# Patient Record
Sex: Female | Born: 1995 | Race: White | Hispanic: No | Marital: Married | Smoking: Never smoker
Health system: Southern US, Community
[De-identification: ages and names within clinical notes are randomized; demographics above are authoritative.]

## PROBLEM LIST (undated history)

## (undated) DIAGNOSIS — N921 Excessive and frequent menstruation with irregular cycle: Secondary | ICD-10-CM

## (undated) DIAGNOSIS — R112 Nausea with vomiting, unspecified: Secondary | ICD-10-CM

## (undated) DIAGNOSIS — F419 Anxiety disorder, unspecified: Secondary | ICD-10-CM

## (undated) DIAGNOSIS — F988 Other specified behavioral and emotional disorders with onset usually occurring in childhood and adolescence: Secondary | ICD-10-CM

## (undated) HISTORY — DX: Nausea with vomiting, unspecified: R11.2

## (undated) HISTORY — DX: Other specified behavioral and emotional disorders with onset usually occurring in childhood and adolescence: F98.8

## (undated) HISTORY — PX: WISDOM TOOTH EXTRACTION: SHX21

## (undated) HISTORY — DX: Excessive and frequent menstruation with irregular cycle: N92.1

---

## 2015-01-15 ENCOUNTER — Encounter: Payer: Self-pay | Admitting: *Deleted

## 2015-01-19 ENCOUNTER — Encounter: Payer: Self-pay | Admitting: Obstetrics and Gynecology

## 2015-01-19 ENCOUNTER — Ambulatory Visit (INDEPENDENT_AMBULATORY_CARE_PROVIDER_SITE_OTHER): Payer: BLUE CROSS/BLUE SHIELD | Admitting: Obstetrics and Gynecology

## 2015-01-19 VITALS — BP 106/72 | HR 97 | Ht 61.0 in | Wt 160.6 lb

## 2015-01-19 DIAGNOSIS — N921 Excessive and frequent menstruation with irregular cycle: Secondary | ICD-10-CM | POA: Diagnosis not present

## 2015-01-19 DIAGNOSIS — N926 Irregular menstruation, unspecified: Secondary | ICD-10-CM | POA: Diagnosis not present

## 2015-01-19 LAB — POCT URINE PREGNANCY: Preg Test, Ur: NEGATIVE

## 2015-01-19 MED ORDER — REPHRESH VA GEL: 1.0000 | VAGINAL | Status: DC | PRN

## 2015-01-19 MED ORDER — MEDROXYPROGESTERONE ACETATE 150 MG/ML IM SUSP: 150.0000 mg | INTRAMUSCULAR | Status: DC

## 2015-01-19 MED ORDER — MEDROXYPROGESTERONE ACETATE 150 MG/ML IM SUSP
150.0000 mg | Freq: Once | INTRAMUSCULAR | Status: AC
  Administered 2015-01-19: 150 mg via INTRAMUSCULAR

## 2015-01-19 NOTE — Addendum Note (Signed)
Addended by: Rosine BeatLONTZ, Cherron Blitzer L on: 01/19/2015 04:11 PM   Modules accepted: Orders

## 2015-01-19 NOTE — Progress Notes (Signed)
Patient ID: Elizabeth Wolf, female   DOB: [birth year], 19 y.o.   MRN: [id number]  S: reports intermittent BTB with Lo Seasonique and nausea.has been off med x 1 month and now no menses with 2 negative UPT.   Reports occasional strong vaginal odor since getting married and being sexually active, denies pain or irritation.  O: A&O X4 Well groomed well developed female in no distress  pelvic exam deferred as not having s/s now  A: BTB on OCPs with unwanted side-effects Suspected occasional vaginosis  UPT negative  P: D/C OCP Start Depo- rx sent in and first injection today, RTC 3 months  Counseled on RepHresh use PRN.   Elizabeth Wolf, CNM

## 2015-01-19 NOTE — Patient Instructions (Signed)

## 2015-04-13 ENCOUNTER — Ambulatory Visit (INDEPENDENT_AMBULATORY_CARE_PROVIDER_SITE_OTHER): Payer: BLUE CROSS/BLUE SHIELD

## 2015-04-13 ENCOUNTER — Ambulatory Visit: Payer: BLUE CROSS/BLUE SHIELD

## 2015-04-13 DIAGNOSIS — N921 Excessive and frequent menstruation with irregular cycle: Secondary | ICD-10-CM

## 2015-04-13 MED ORDER — MEDROXYPROGESTERONE ACETATE 150 MG/ML IM SUSP: 150.0000 mg | INTRAMUSCULAR | Status: DC

## 2015-04-13 NOTE — Progress Notes (Signed)
Patient ID: Elizabeth Wolf, female   DOB: Sep 17, 1995, 19 y.o.   MRN: [id number]   Pt did not have depo with her. Pt was going to get depo at pharmacy. Called back and spoke with RF-decided to r/s appt instead.

## 2015-04-17 ENCOUNTER — Ambulatory Visit: Payer: BLUE CROSS/BLUE SHIELD

## 2016-10-31 ENCOUNTER — Telehealth: Payer: Self-pay | Admitting: Obstetrics and Gynecology

## 2016-10-31 NOTE — Telephone Encounter (Signed)
Triage call report received - patient states her period is 4 months late she has taken multiple pregnancy test - not pregnant having nausea and vomiting - attempt to contact patient to schedule appointment LVM

## 2017-05-29 ENCOUNTER — Encounter: Payer: BLUE CROSS/BLUE SHIELD | Admitting: Certified Nurse Midwife

## 2017-06-01 ENCOUNTER — Other Ambulatory Visit: Payer: Self-pay | Admitting: Certified Nurse Midwife

## 2017-06-01 ENCOUNTER — Other Ambulatory Visit (INDEPENDENT_AMBULATORY_CARE_PROVIDER_SITE_OTHER): Payer: Self-pay

## 2017-06-01 ENCOUNTER — Encounter: Payer: Self-pay | Admitting: Certified Nurse Midwife

## 2017-06-01 ENCOUNTER — Ambulatory Visit: Payer: Self-pay | Admitting: Certified Nurse Midwife

## 2017-06-01 VITALS — BP 97/71 | HR 88 | Ht 61.0 in | Wt 179.1 lb

## 2017-06-01 DIAGNOSIS — Z3687 Encounter for antenatal screening for uncertain dates: Secondary | ICD-10-CM

## 2017-06-01 DIAGNOSIS — N926 Irregular menstruation, unspecified: Secondary | ICD-10-CM

## 2017-06-01 DIAGNOSIS — N921 Excessive and frequent menstruation with irregular cycle: Secondary | ICD-10-CM

## 2017-06-01 NOTE — Progress Notes (Signed)
GYN ENCOUNTER NOTE  Subjective:       Shyler A Doreene ElandHendrix is a 21 y.o. [id number] female here for pregnancy confirmation.   Reports four (4) positive home pregnancy tests last week and occasional lower abdominal cramping.   Denies difficulty breathing or respiratory distress, chest pain, abdominal pain, vaginal bleeding, and leg pain or swelling.   History significant for irregular periods.    Gynecologic History  No LMP recorded (lmp unknown). Patient is pregnant.  Contraception: none  Last Pap: due.   Obstetric History  OB History  Gravida Para Term Preterm AB Living  1 0 0 0 0 0  SAB TAB Ectopic Multiple Live Births  0 0 0 0      # Outcome Date GA Lbr Len/2nd Weight Sex Delivery Anes PTL Lv  1 Current               Past Medical History:  Diagnosis Date  . ADD (attention deficit disorder)   . Breakthrough bleeding on OCPs   . Nausea & vomiting     Past Surgical History:  Procedure Laterality Date  . WISDOM TOOTH EXTRACTION     No Known Allergies  Social History   Socioeconomic History  . Marital status: Married    Spouse name: Not on file  . Number of children: Not on file  . Years of education: Not on file  . Highest education level: Not on file  Social Needs  . Financial resource strain: Not on file  . Food insecurity - worry: Not on file  . Food insecurity - inability: Not on file  . Transportation needs - medical: Not on file  . Transportation needs - non-medical: Not on file  Occupational History  . Not on file  Tobacco Use  . Smoking status: Never Smoker  . Smokeless tobacco: Never Used  Substance and Sexual Activity  . Alcohol use: No  . Drug use: No  . Sexual activity: Yes    Birth control/protection: None  Other Topics Concern  . Not on file  Social History Narrative  . Not on file    Family History  Problem Relation Age of Onset  . Diabetes Mother   . Cancer Paternal Grandfather     The following portions of the patient's  history were reviewed and updated as appropriate: allergies, current medications, past family history, past medical history, past social history, past surgical history and problem list.  Review of Systems  Review of Systems - Negative except as noted above.  History obtained from the patient.   Objective:   BP 97/71   Pulse 88   Ht 5\' 1"  (1.549 m)   Wt 179 lb 1 oz (81.2 kg)   LMP  (LMP Unknown)   BMI 33.83 kg/m    CONSTITUTIONAL: Well-developed, well-nourished female in no acute distress.   HENT:  Normocephalic, atraumatic.   NECK: Normal range of motion, supple, no masses.     SKIN: Skin is warm and dry. No rash noted. Not diaphoretic. No erythema. No pallor.  NEUROLGIC: Alert and oriented to person, place, and time.   PSYCHIATRIC: Normal mood and affect. Normal behavior. Normal judgment and thought content.  ABDOMEN: Soft, non distended; Non tender.  No Organomegaly.  MUSCULOSKELETAL: Normal range of motion. No tenderness.  No cyanosis, clubbing, or edema.  Positive UPT  ULTRASOUND REPORT  Location: [facility] Date of Service:  06/01/17  Indications:  Unsure LMP Findings:  A probable gestational sac is visualized within the  endometrium in the fundus of the uterus measuring 5 6/[redacted] weeks gestation.  Gestational sac appears irregular and a small [facility] is noted measuring 6.8 x 8.1 x 3.5 mm.  A fetal pole is not visualized at this time.    Yolk sac appears irregular.  Right Ovary measures 2.5 x 1.2 x 1.4 cm and appears WNL.  Left Ovary measures 3.1 x 2.3 x 2.3 cm and appears WNL.  There is no obvious evidence of a corpus luteal cyst. Survey of the adnexa demonstrates a left adnexal mass measuring 2.9 x 2.7 x 2.4 cm.  Color flow is noted to this mass. There is no free peritoneal fluid in the cul de sac.  Impression: 1. 5 6/7 week irregular gestational sac seen by U/S on today's exam. 2. No fetal pole visualized at this time. 3. Mass noted in left adnexa  lying between uterus and left ovary measuring 2.9 x 2.7 x 2.4 cm.  Recommendations: 1.Clinical correlation with the patient's History and Physical Exam. 2. F/U U/S recommended in 2 weeks to reassess viability and re-evaluate left adnexal mass.  Assessment:   1. Missed menses  - POCT urine pregnancy - Beta HCG, Quant  2. Unsure of LMP (last menstrual period) as reason for ultrasound scan  Plan:   Labs: Beta, will notify pt via MyChart with results.   Reviewed red flag symptoms and when to call.  RTC x 2 weeks for follow up US or sooner if needed.    Gunnar BullaJenkins Michelle Tomer Chalmers, CNM

## 2017-06-01 NOTE — Patient Instructions (Signed)
Morning Sickness Morning sickness is when you feel sick to your stomach (nauseous) during pregnancy. You may feel sick to your stomach and throw up (vomit). You may feel sick in the morning, but you can feel this way any time of day. Some women feel very sick to their stomach and cannot stop throwing up (hyperemesis gravidarum). Follow these instructions at home:  Only take medicines as told by your doctor.  Take multivitamins as told by your doctor. Taking multivitamins before getting pregnant can stop or lessen the harshness of morning sickness.  Eat dry toast or unsalted crackers before getting out of bed.  Eat 5 to 6 small meals a day.  Eat dry and bland foods like rice and baked potatoes.  Do not drink liquids with meals. Drink between meals.  Do not eat greasy, fatty, or spicy foods.  Have someone cook for you if the smell of food causes you to feel sick or throw up.  If you feel sick to your stomach after taking prenatal vitamins, take them at night or with a snack.  Eat protein when you need a snack (nuts, yogurt, cheese).  Eat unsweetened gelatins for dessert.  Wear a bracelet used for sea sickness (acupressure wristband).  Go to a doctor that puts thin needles into certain body points (acupuncture) to improve how you feel.  Do not smoke.  Use a humidifier to keep the air in your house free of odors.  Get lots of fresh air. Contact a doctor if:  You need medicine to feel better.  You feel dizzy or lightheaded.  You are losing weight. Get help right away if:  You feel very sick to your stomach and cannot stop throwing up.  You pass out (faint). This information is not intended to replace advice given to you by your health care provider. Make sure you discuss any questions you have with your health care provider. Document Released: 08/14/2004 Document Revised: 12/13/2015 Document Reviewed: 12/22/2012 Elsevier Interactive Patient Education  2017 [place] for Pregnant Women While you are pregnant, your body will require additional nutrition to help support your growing baby. It is recommended that you consume:  150 additional calories each day during your first trimester.  300 additional calories each day during your second trimester.  300 additional calories each day during your third trimester.  Eating a healthy, well-balanced diet is very important for your health and for your baby's health. You also have a higher need for some vitamins and minerals, such as folic acid, calcium, iron, and vitamin D. What do I need to know about eating during pregnancy?  Do not try to lose weight or go on a diet during pregnancy.  Choose healthy, nutritious foods. Choose  of a sandwich with a glass of milk instead of a candy bar or a high-calorie sugar-sweetened beverage.  Limit your overall intake of foods that have "empty calories." These are foods that have little nutritional value, such as sweets, desserts, candies, sugar-sweetened beverages, and fried foods.  Eat a variety of foods, especially fruits and vegetables.  Take a prenatal vitamin to help meet the additional needs during pregnancy, specifically for folic acid, iron, calcium, and vitamin D.  Remember to stay active. Ask your health care provider for exercise recommendations that are specific to you.  Practice good food safety and cleanliness, such as washing your hands before you eat and after you prepare raw meat. This helps to prevent foodborne illnesses, such as listeriosis, that can  be very dangerous for your baby. Ask your health care provider for more information about listeriosis. What does 150 extra calories look like? Healthy options for an additional 150 calories each day could be any of the following:  Plain low-fat yogurt (6-8 oz) with  cup of berries.  1 apple with 2 teaspoons of peanut butter.  Cut-up vegetables with  cup of hummus.  Low-fat  chocolate milk (8 oz or 1 cup).  1 string cheese with 1 medium orange.   of a peanut butter and jelly sandwich on whole-wheat bread (1 tsp of peanut butter).  For 300 calories, you could eat two of those healthy options each day. What is a healthy amount of weight to gain? The recommended amount of weight for you to gain is based on your pre-pregnancy BMI. If your pre-pregnancy BMI was:  Less than 18 (underweight), you should gain 28-40 lb.  18-24.9 (normal), you should gain 25-35 lb.  25-29.9 (overweight), you should gain 15-25 lb.  Greater than 30 (obese), you should gain 11-20 lb.  What if I am having twins or multiples? Generally, pregnant women who will be having twins or multiples may need to increase their daily calories by 300-600 calories each day. The recommended range for total weight gain is 25-54 lb, depending on your pre-pregnancy BMI. Talk with your health care provider for specific guidance about additional nutritional needs, weight gain, and exercise during your pregnancy. What foods can I eat? Grains Any grains. Try to choose whole grains, such as whole-wheat bread, oatmeal, or brown rice. Vegetables Any vegetables. Try to eat a variety of colors and types of vegetables to get a full range of vitamins and minerals. Remember to wash your vegetables well before eating. Fruits Any fruits. Try to eat a variety of colors and types of fruit to get a full range of vitamins and minerals. Remember to wash your fruits well before eating. Meats and Other Protein Sources Lean meats, including chicken, [place], fish, and lean cuts of beef, veal, or pork. Make sure that all meats are cooked to "well done." Tofu. Tempeh. Beans. Eggs. Peanut butter and other nut butters. Seafood, such as shrimp, crab, and lobster. If you choose fish, select types that are higher in omega-3 fatty acids, including salmon, herring, mussels, trout, sardines, and pollock. Make sure that all meats are cooked  to food-safe temperatures. Dairy Pasteurized milk and milk alternatives. Pasteurized yogurt and pasteurized cheese. Cottage cheese. Sour cream. Beverages Water. Juices that contain 100% fruit juice or vegetable juice. Caffeine-free teas and decaffeinated coffee. Drinks that contain caffeine are okay to drink, but it is better to avoid caffeine. Keep your total caffeine intake to less than 200 mg each day (12 oz of coffee, tea, or soda) or as directed by your health care provider. Condiments Any pasteurized condiments. Sweets and Desserts Any sweets and desserts. Fats and Oils Any fats and oils. The items listed above may not be a complete list of recommended foods or beverages. Contact your dietitian for more options. What foods are not recommended? Vegetables Unpasteurized (raw) vegetable juices. Fruits Unpasteurized (raw) fruit juices. Meats and Other Protein Sources Cured meats that have nitrates, such as bacon, salami, and hotdogs. Luncheon meats, bologna, or other deli meats (unless they are reheated until they are steaming hot). Refrigerated pate, meat spreads from a meat counter, smoked seafood that is found in the refrigerated section of a store. Raw fish, such as sushi or sashimi. High mercury content fish, such as tilefish,  shark, swordfish, and king mackerel. Raw meats, such as tuna or beef tartare. Undercooked meats and poultry. Make sure that all meats are cooked to food-safe temperatures. Dairy Unpasteurized (raw) milk and any foods that have raw milk in them. Soft cheeses, such as feta, queso blanco, queso fresco, Brie, Camembert cheeses, blue-veined cheeses, and Panela cheese (unless it is made with pasteurized milk, which must be stated on the label). Beverages Alcohol. Sugar-sweetened beverages, such as sodas, teas, or energy drinks. Condiments Homemade fermented foods and drinks, such as pickles, sauerkraut, or kombucha drinks. (Store-bought pasteurized versions of these  are okay.) Other Salads that are made in the store, such as ham salad, chicken salad, egg salad, tuna salad, and seafood salad. The items listed above may not be a complete list of foods and beverages to avoid. Contact your dietitian for more information. This information is not intended to replace advice given to you by your health care provider. Make sure you discuss any questions you have with your health care provider. Document Released: 04/21/2014 Document Revised: 12/13/2015 Document Reviewed: 12/20/2013 Elsevier Interactive Patient Education  2018 [employer]. Common Medications Safe in Pregnancy  Acne:      Constipation:  Benzoyl Peroxide     Colace  Clindamycin      Dulcolax Suppository  Topica Erythromycin     Fibercon  Salicylic Acid      Metamucil         Miralax AVOID:        Senakot   Accutane    Cough:  Retin-A       Cough Drops  Tetracycline      Phenergan w/ Codeine if Rx  Minocycline      Robitussin (Plain & DM)  Antibiotics:     Crabs/Lice:  Ceclor       RID  Cephalosporins    AVOID:  E-Mycins      Kwell  Keflex  Macrobid/Macrodantin   Diarrhea:  Penicillin      Kao-Pectate  Zithromax      Imodium AD         PUSH FLUIDS AVOID:       Cipro     Fever:  Tetracycline      Tylenol (Regular or Extra  Minocycline       Strength)  Levaquin      Extra Strength-Do not          Exceed 8 tabs/24 hrs Caffeine:        <280m/day (equiv. To 1 cup of coffee or  approx. 3 12 oz sodas)         Gas: Cold/Hayfever:       Gas-X  Benadryl      Mylicon  Claritin       Phazyme  **Claritin-D        Chlor-Trimeton    Headaches:  Dimetapp      ASA-Free Excedrin  Drixoral-Non-Drowsy     Cold Compress  Mucinex (Guaifenasin)     Tylenol (Regular or Extra  Sudafed/Sudafed-12 Hour     Strength)  **Sudafed PE Pseudoephedrine   Tylenol Cold & Sinus     Vicks Vapor Rub  Zyrtec  **AVOID if Problems With Blood Pressure         Heartburn: Avoid lying down for at least 1  hour after meals  Aciphex      Maalox     Rash:  Milk of Magnesia     Benadryl    Mylanta  1% Hydrocortisone Cream  Pepcid  Pepcid Complete   Sleep Aids:  Prevacid      Ambien   Prilosec       Benadryl  Rolaids       Chamomile Tea  Tums (Limit 4/day)     Unisom  Zantac       Tylenol PM         Warm milk-add vanilla or  Hemorrhoids:       Sugar for taste  Anusol/Anusol H.C.  (RX: Analapram 2.5%)  Sugar Substitutes:  Hydrocortisone OTC     Ok in moderation  Preparation H      Tucks        Vaseline lotion applied to tissue with wiping    Herpes:     Throat:  Acyclovir      Oragel  Famvir  Valtrex     Vaccines:         Flu Shot Leg Cramps:       *Gardasil  Benadryl      Hepatitis A         Hepatitis B Nasal Spray:       Pneumovax  Saline Nasal Spray     Polio Booster         Tetanus Nausea:       Tuberculosis test or PPD  Vitamin B6 25 mg TID   AVOID:    Dramamine      *Gardasil  Emetrol       Live Poliovirus  Ginger Root 250 mg QID    MMR (measles, mumps &  High Complex Carbs @ Bedtime    rebella)  Sea Bands-Accupressure    Varicella (Chickenpox)  Unisom 1/2 tab TID     *No known complications           If received before Pain:         Known pregnancy;   Darvocet       Resume series after  Lortab        Delivery  Percocet    Yeast:   Tramadol      Femstat  Tylenol 3      Gyne-lotrimin  Ultram       Monistat  Vicodin           MISC:         All Sunscreens           Hair Coloring/highlights          Insect Repellant's          (Including DEET)         Mystic Tans

## 2017-06-01 NOTE — Progress Notes (Signed)
New pt is here for a confirmation of pregnancy. Unsure of LMP.

## 2017-06-02 ENCOUNTER — Telehealth: Payer: Self-pay | Admitting: Certified Nurse Midwife

## 2017-06-02 DIAGNOSIS — N9489 Other specified conditions associated with female genital organs and menstrual cycle: Secondary | ICD-10-CM

## 2017-06-02 DIAGNOSIS — N926 Irregular menstruation, unspecified: Secondary | ICD-10-CM

## 2017-06-02 LAB — BETA HCG QUANT (REF LAB): HCG QUANT: 9303 m[IU]/mL

## 2017-06-02 NOTE — Telephone Encounter (Signed)
1200: Lab results and plan of care as outlined above reviewed with patient. Pt verbalized understanding. Placed on hold to schedule appointment for lab draw tomorrow and follow up appointment Thursday.   Reviewed red flag symptoms and when to call.    Gunnar BullaJenkins Michelle Tamie Minteer, CNM [facility], [facility]

## 2017-06-02 NOTE — Telephone Encounter (Signed)
0830: Lab results and ultrasound findings reviewed with Dr. Valentino Saxonherry. Advised repeat Beta on Wednesday and follow up with patient on Thursday for results review. Depending on results follow up ultrasound should be scheduled for Monday. Will contact patient to discuss plan of care.   Gunnar BullaJenkins Michelle Kasey Hansell, CNM [facility], [facility]  0904:HIPPA approved message left on identified voicemail asking patient to call back.    Gunnar BullaJenkins Michelle Ameliarose Shark, CNM [facility], [facility]

## 2017-06-03 ENCOUNTER — Other Ambulatory Visit: Payer: Self-pay

## 2017-06-03 DIAGNOSIS — N926 Irregular menstruation, unspecified: Secondary | ICD-10-CM

## 2017-06-03 DIAGNOSIS — N9489 Other specified conditions associated with female genital organs and menstrual cycle: Secondary | ICD-10-CM

## 2017-06-03 LAB — BETA HCG QUANT (REF LAB): HCG QUANT: 15885 m[IU]/mL

## 2017-06-04 ENCOUNTER — Ambulatory Visit (INDEPENDENT_AMBULATORY_CARE_PROVIDER_SITE_OTHER): Payer: BLUE CROSS/BLUE SHIELD | Admitting: Certified Nurse Midwife

## 2017-06-04 ENCOUNTER — Encounter: Payer: Self-pay | Admitting: Certified Nurse Midwife

## 2017-06-04 VITALS — BP 110/65 | HR 82 | Wt 181.9 lb

## 2017-06-04 DIAGNOSIS — Z09 Encounter for follow-up examination after completed treatment for conditions other than malignant neoplasm: Secondary | ICD-10-CM | POA: Diagnosis not present

## 2017-06-04 DIAGNOSIS — N9489 Other specified conditions associated with female genital organs and menstrual cycle: Secondary | ICD-10-CM

## 2017-06-04 DIAGNOSIS — N926 Irregular menstruation, unspecified: Secondary | ICD-10-CM | POA: Diagnosis not present

## 2017-06-04 DIAGNOSIS — N949 Unspecified condition associated with female genital organs and menstrual cycle: Secondary | ICD-10-CM

## 2017-06-04 NOTE — Patient Instructions (Signed)
Molar Pregnancy A molar pregnancy (hydatidiform mole) is a mass of tissue that grows in the uterus after conception. The mass is created by an egg that was not fertilized correctly and abnormally grows. It is an abnormal pregnancy and does not develop into a fetus. If a molar pregnancy is suspected by your health care provider, treatment is required. What are the causes? Molar pregnancy is caused by an egg that is fertilized incorrectly so that it has abnormal genetic material (chromosomes). This can result in one of 2 types of molar pregnancy:  Complete molar pregnancy-All of the chromosomes in the fertilized egg come from the father; none come from the mother.  Partial molar pregnancy-The fertilized egg has chromosomes from the father and mother, but it has too many chromosomes.  What increases the risk? Certain risk factors make a molar pregnancy more likely. They include:  Being over age 23 or under age 23.  History of a molar pregnancy in the past (extremely small chance of recurrence).  Other possible risk factors include:  Smoking more than 15 cigarettes per day.  History of infertility.  Having a certain blood type (A, B, AB).  Having a vitamin A deficiency.  Using oral contraceptives.  What are the signs or symptoms?  Vaginal bleeding.  Missed menstrual period.  Uterus grows quicker than normal.  Severe nausea and vomiting.  Severe pressure or pain in the uterus.  Abnormal ovarian cysts (theca lutein cysts).  Discharge from the vagina that looks like grapes.  High blood pressure (early onset of preeclampsia).  Overactive thyroid (hyperthyroidism).  Anemia. How is this diagnosed? If your health care provider thinks there is a chance of a molar pregnancy, testing will be recommended. Possible tests include:  An ultrasound test.  Blood tests.  How is this treated? Most molar pregnancies end on their own by miscarriage. However, a health care provider  needs to make sure that all the abnormal tissue is out of the womb. This can be done with dilation and curettage (D&C) or suction curettage. In this procedure, any remaining molar tissue is removed through the vagina. After diagnosis of a molar pregnancy, the pregnancy hormone levels must be followed until the level is zero. If the pregnancy hormone level does not drop appropriately, chemotherapy may be necessary. Also, you will be given a medicine called Rho (D) immune globulin if you are Rh negative and your sex partner is Rh positive. This helps prevent Rh problems in future pregnancies. Follow these instructions at home:  Avoid getting pregnant for 6-12 months or as directed by your health care provider. Use a reliable form of birth control or do not have sex.  Only take over-the-counter or prescription medicine as directed by your health care provider.  Keep all follow-up appointments and get all suggested lab tests and ultrasound tests.  Gradually return to normal activities.  Think about joining a support group. Ask for help if you are struggling with grief. This information is not intended to replace advice given to you by your health care provider. Make sure you discuss any questions you have with your health care provider. Document Released: 03/25/2011 Document Revised: 12/13/2015 Document Reviewed: 02/03/2013 Elsevier Interactive Patient Education  2017 [employer] Ectopic Pregnancy An ectopic pregnancy happens when a fertilized egg grows outside the uterus. A pregnancy cannot live outside of the uterus. This problem often happens in the fallopian tube. It is often caused by damage to the fallopian tube. If this problem is found early, you  may be treated with medicine. If your tube tears or bursts open (ruptures), you will bleed inside. This is an emergency. You will need surgery. Get help right away. What are the signs or symptoms? You may have normal pregnancy symptoms at first.  These include:  Missing your period.  Feeling sick to your stomach (nauseous).  Being tired.  Having tender breasts.  Then, you may start to have symptoms that are not normal. These include:  Pain with sex (intercourse).  Bleeding from the vagina. This includes light bleeding (spotting).  Belly (abdomen) or lower belly cramping or pain. This may be felt on one side.  A fast heartbeat (pulse).  Passing out (fainting) after going poop (bowel movement).  If your tube tears, you may have symptoms such as:  Really bad pain in the belly or lower belly. This happens suddenly.  Dizziness.  Passing out.  Shoulder pain.  Get help right away if: You have any of these symptoms. This is an emergency. This information is not intended to replace advice given to you by your health care provider. Make sure you discuss any questions you have with your health care provider. Document Released: 10/03/2008 Document Revised: 12/13/2015 Document Reviewed: 02/16/2013 Elsevier Interactive Patient Education  2017 [employer]

## 2017-06-05 ENCOUNTER — Other Ambulatory Visit: Payer: Self-pay | Admitting: Certified Nurse Midwife

## 2017-06-05 DIAGNOSIS — N911 Secondary amenorrhea: Secondary | ICD-10-CM

## 2017-06-05 DIAGNOSIS — Z3201 Encounter for pregnancy test, result positive: Secondary | ICD-10-CM

## 2017-06-06 ENCOUNTER — Encounter: Payer: Self-pay | Admitting: Certified Nurse Midwife

## 2017-06-06 NOTE — Progress Notes (Signed)
GYN ENCOUNTER NOTE  Subjective:       Elizabeth Wolf is a 21 y.o. G50P0000 female here for follow up appointment and results review.   Pt was first seen by myself on Monday, 06/01/2017, for pregnancy confirmation. An ultrasound was preformed due to history of irregular periods, abdominal discomfort/distention and obesity.   An irregular yolk sac and adnexal mass were visualized on exam. Strict ectopic precautions were given and patient returned to office on Wednesday for repeat beta hCG.   Pt and spouse are here together today for results review and plan of care discussion.   Denies difficulty breathing or respiratory distress, chest pain, abdominal pain, vaginal bleeding, dysuria, and leg pain or swelling.   Questions and fears cancer and loss of pregnancy.    Gynecologic History  No LMP recorded (lmp unknown). Patient is pregnant.  Contraception: none  Last Pap: due.  Obstetric History  OB History  Gravida Para Term Preterm AB Living  1 0 0 0 0 0  SAB TAB Ectopic Multiple Live Births  0 0 0 0      # Outcome Date GA Lbr Len/2nd Weight Sex Delivery Anes PTL Lv  1 Current               Past Medical History:  Diagnosis Date  . ADD (attention deficit disorder)   . Breakthrough bleeding on OCPs   . Nausea & vomiting     Past Surgical History:  Procedure Laterality Date  . WISDOM TOOTH EXTRACTION      Current Outpatient Medications on File Prior to Visit  Medication Sig Dispense Refill  . Prenatal Vit-Fe Fumarate-FA (PRENATAL MULTIVITAMIN) TABS tablet Take 1 tablet daily at 12 noon by mouth.     No current facility-administered medications on file prior to visit.     No Known Allergies  Social History   Socioeconomic History  . Marital status: Married    Spouse name: Not on file  . Number of children: Not on file  . Years of education: Not on file  . Highest education level: Not on file  Social Needs  . Financial resource strain: Not on file  . Food  insecurity - worry: Not on file  . Food insecurity - inability: Not on file  . Transportation needs - medical: Not on file  . Transportation needs - non-medical: Not on file  Occupational History  . Not on file  Tobacco Use  . Smoking status: Never Smoker  . Smokeless tobacco: Never Used  Substance and Sexual Activity  . Alcohol use: No  . Drug use: No  . Sexual activity: Yes    Birth control/protection: None  Other Topics Concern  . Not on file  Social History Narrative  . Not on file    Family History  Problem Relation Age of Onset  . Diabetes Mother   . Cancer Paternal Grandfather     The following portions of the patient's history were reviewed and updated as appropriate: allergies, current medications, past family history, past medical history, past social history, past surgical history and problem list.  Review of Systems Review of Systems - Negative except as noted above.  History obtained from the patient  Objective:   BP 110/65   Pulse 82   Wt 181 lb 14.4 oz (82.5 kg)   LMP  (LMP Unknown)   BMI 34.37 kg/m    CONSTITUTIONAL: Well-developed, well-nourished female in no acute distress.   HENT:  Normocephalic, atraumatic.   NECK:  Normal range of motion, supple, no masses.     SKIN: Skin is warm and dry. No rash noted. Not diaphoretic. No erythema. No pallor.  NEUROLGIC: Alert and oriented to person, place, and time.   PSYCHIATRIC: Normal mood and affect. Normal behavior. Normal judgment and thought content.  ABDOMEN: Soft, non distended; Non tender.  No Organomegaly.  MUSCULOSKELETAL: Normal range of motion. No tenderness.  No cyanosis, clubbing, or edema.  ULTRASOUND REPORT  Location: [facility] Date of Service:  06/01/17  Indications:  Unsure LMP Findings:  A probable gestational sac is visualized within the endometrium in the fundus of the uterus measuring 5 6/[redacted] weeks gestation.  Gestational sac appears irregular and a small [facility]  is noted measuring 6.8 x 8.1 x 3.5 mm.  A fetal pole is not visualized at this time.    Yolk sac appears irregular.  Right Ovary measures 2.5 x 1.2 x 1.4 cm and appears WNL.  Left Ovary measures 3.1 x 2.3 x 2.3 cm and appears WNL.  There is no obvious evidence of a corpus luteal cyst. Survey of the adnexa demonstrates a left adnexal mass measuring 2.9 x 2.7 x 2.4 cm.  Color flow is noted to this mass. There is no free peritoneal fluid in the cul de sac.  Impression: 1. 5 6/7 week irregular gestational sac seen by U/S on today's exam. 2. No fetal pole visualized at this time. 3. Mass noted in left adnexa lying between uterus and left ovary measuring 2.9 x 2.7 x 2.4 cm.  Recommendations: 1.Clinical correlation with the patient's History and Physical Exam. 2. F/U U/S recommended in 2 weeks to reassess viability and re-evaluate left adnexal mass.   Labs  Recent Results (from the past 2160 hour(s))  Beta HCG, Quant     Status: None   Collection Time: 06/01/17  2:02 PM  Result Value Ref Range   hCG Quant 9,303 mIU/mL    Comment:                      Female (Non-pregnant)    0 -     5                             (Postmenopausal)  0 -     8                      Female (Pregnant)                      Weeks of Gestation                              3                6 -    71                              4               10 -   750                              5              217 -  7138  6              158 - 31795                              7             [id number] -[id number]                              8            32065 -[id number]                              9            63803 -[id number]                             10            46509 -[id number]                             12            27832 -[id number]                             14            13950 - 62530                             15            12039 - 70971                             16             9040 -  56451                             17             8175 - [phone] Roche E CLIA methodology   Beta HCG, Quant     Status: None   Collection Time: 06/03/17  1:30 PM  Result Value Ref Range   hCG Quant 15,885 mIU/mL    Comment:                      Female (Non-pregnant)    0 -     5                             (Postmenopausal)  0 -     8                      Female (Pregnant)  Weeks of Gestation                              3                6 -    71                              4               10 -   750                              5              217 -  7138                              6              158 - [place]                              7             3697 -[place]  18             8099 - [id number] Roche E CLIA methodology       Assessment:   1. Missed menses   2. Adnexal mass, left   3. Follow up    Plan:   Ultrasound and lab results reviewed with patient and spouse in detail. Definitions given for mass, tumor, cancer, and pregnancy.   Advised repeat ultrasound needed this coming Monday.   Strict ectopic precautions given. Reviewed red flag symptoms and when to call, pt verbalized understanding.   RTC x Monday for follow up ultrasound.    Gunnar BullaJenkins Michelle Mishon Blubaugh, CNM

## 2017-06-08 ENCOUNTER — Encounter: Payer: Self-pay | Admitting: Certified Nurse Midwife

## 2017-06-08 ENCOUNTER — Ambulatory Visit (INDEPENDENT_AMBULATORY_CARE_PROVIDER_SITE_OTHER): Payer: BLUE CROSS/BLUE SHIELD | Admitting: Certified Nurse Midwife

## 2017-06-08 ENCOUNTER — Ambulatory Visit (INDEPENDENT_AMBULATORY_CARE_PROVIDER_SITE_OTHER): Payer: BLUE CROSS/BLUE SHIELD

## 2017-06-08 VITALS — BP 109/68 | HR 85 | Ht 61.0 in | Wt 179.1 lb

## 2017-06-08 DIAGNOSIS — N911 Secondary amenorrhea: Secondary | ICD-10-CM

## 2017-06-08 DIAGNOSIS — Z09 Encounter for follow-up examination after completed treatment for conditions other than malignant neoplasm: Secondary | ICD-10-CM

## 2017-06-08 DIAGNOSIS — N926 Irregular menstruation, unspecified: Secondary | ICD-10-CM

## 2017-06-08 DIAGNOSIS — Z3201 Encounter for pregnancy test, result positive: Secondary | ICD-10-CM

## 2017-06-08 NOTE — Patient Instructions (Signed)
Morning Sickness Morning sickness is when you feel sick to your stomach (nauseous) during pregnancy. You may feel sick to your stomach and throw up (vomit). You may feel sick in the morning, but you can feel this way any time of day. Some women feel very sick to their stomach and cannot stop throwing up (hyperemesis gravidarum). Follow these instructions at home:  Only take medicines as told by your doctor.  Take multivitamins as told by your doctor. Taking multivitamins before getting pregnant can stop or lessen the harshness of morning sickness.  Eat dry toast or unsalted crackers before getting out of bed.  Eat 5 to 6 small meals a day.  Eat dry and bland foods like rice and baked potatoes.  Do not drink liquids with meals. Drink between meals.  Do not eat greasy, fatty, or spicy foods.  Have someone cook for you if the smell of food causes you to feel sick or throw up.  If you feel sick to your stomach after taking prenatal vitamins, take them at night or with a snack.  Eat protein when you need a snack (nuts, yogurt, cheese).  Eat unsweetened gelatins for dessert.  Wear a bracelet used for sea sickness (acupressure wristband).  Go to a doctor that puts thin needles into certain body points (acupuncture) to improve how you feel.  Do not smoke.  Use a humidifier to keep the air in your house free of odors.  Get lots of fresh air. Contact a doctor if:  You need medicine to feel better.  You feel dizzy or lightheaded.  You are losing weight. Get help right away if:  You feel very sick to your stomach and cannot stop throwing up.  You pass out (faint). This information is not intended to replace advice given to you by your health care provider. Make sure you discuss any questions you have with your health care provider. Document Released: 08/14/2004 Document Revised: 12/13/2015 Document Reviewed: 12/22/2012 Elsevier Interactive Patient Education  2017 [place] of Pregnancy The first trimester of pregnancy is from week 1 until the end of week 13 (months 1 through 3). During this time, your baby will begin to develop inside you. At 6-8 weeks, the eyes and face are formed, and the heartbeat can be seen on ultrasound. At the end of 12 weeks, all the baby's organs are formed. Prenatal care is all the medical care you receive before the birth of your baby. Make sure you get good prenatal care and follow all of your doctor's instructions. Follow these instructions at home: Medicines  Take over-the-counter and prescription medicines only as told by your doctor. Some medicines are safe and some medicines are not safe during pregnancy.  Take a prenatal vitamin that contains at least 600 micrograms (mcg) of folic acid.  If you have trouble pooping (constipation), take medicine that will make your stool soft (stool softener) if your doctor approves. Eating and drinking  Eat regular, healthy meals.  Your doctor will tell you the amount of weight gain that is right for you.  Avoid raw meat and uncooked cheese.  If you feel sick to your stomach (nauseous) or throw up (vomit): ? Eat 4 or 5 small meals a day instead of 3 large meals. ? Try eating a few soda crackers. ? Drink liquids between meals instead of during meals.  To prevent constipation: ? Eat foods that are high in fiber, like fresh fruits and vegetables, whole grains, and beans. ?  Drink enough fluids to keep your pee (urine) clear or pale yellow. Activity  Exercise only as told by your doctor. Stop exercising if you have cramps or pain in your lower belly (abdomen) or low back.  Do not exercise if it is too hot, too humid, or if you are in a place of great height (high altitude).  Try to avoid standing for long periods of time. Move your legs often if you must stand in one place for a long time.  Avoid heavy lifting.  Wear low-heeled shoes. Sit and stand up  straight.  You can have sex unless your doctor tells you not to. Relieving pain and discomfort  Wear a good support bra if your breasts are sore.  Take warm water baths (sitz baths) to soothe pain or discomfort caused by hemorrhoids. Use hemorrhoid cream if your doctor says it is okay.  Rest with your legs raised if you have leg cramps or low back pain.  If you have puffy, bulging veins (varicose veins) in your legs: ? Wear support hose or compression stockings as told by your doctor. ? Raise (elevate) your feet for 15 minutes, 3-4 times a day. ? Limit salt in your food. Prenatal care  Schedule your prenatal visits by the twelfth week of pregnancy.  Write down your questions. Take them to your prenatal visits.  Keep all your prenatal visits as told by your doctor. This is important. Safety  Wear your seat belt at all times when driving.  Make a list of emergency phone numbers. The list should include numbers for family, friends, the hospital, and police and fire departments. General instructions  Ask your doctor for a referral to a local prenatal class. Begin classes no later than at the start of month 6 of your pregnancy.  Ask for help if you need counseling or if you need help with nutrition. Your doctor can give you advice or tell you where to go for help.  Do not use hot tubs, steam rooms, or saunas.  Do not douche or use tampons or scented sanitary pads.  Do not cross your legs for long periods of time.  Avoid all herbs and alcohol. Avoid drugs that are not approved by your doctor.  Do not use any tobacco products, including cigarettes, chewing tobacco, and electronic cigarettes. If you need help quitting, ask your doctor. You may get counseling or other support to help you quit.  Avoid cat litter boxes and soil used by cats. These carry germs that can cause birth defects in the baby and can cause a loss of your baby (miscarriage) or stillbirth.  Visit your dentist.  At home, brush your teeth with a soft toothbrush. Be gentle when you floss. Contact a doctor if:  You are dizzy.  You have mild cramps or pressure in your lower belly.  You have a nagging pain in your belly area.  You continue to feel sick to your stomach, you throw up, or you have watery poop (diarrhea).  You have a bad smelling fluid coming from your vagina.  You have pain when you pee (urinate).  You have increased puffiness (swelling) in your face, hands, legs, or ankles. Get help right away if:  You have a fever.  You are leaking fluid from your vagina.  You have spotting or bleeding from your vagina.  You have very bad belly cramping or pain.  You gain or lose weight rapidly.  You throw up blood. It may look like coffee  grounds.  You are around people who have [place] measles, fifth disease, or chickenpox.  You have a very bad headache.  You have shortness of breath.  You have any kind of trauma, such as from a fall or a car accident. Summary  The first trimester of pregnancy is from week 1 until the end of week 13 (months 1 through 3).  To take care of yourself and your unborn baby, you will need to eat healthy meals, take medicines only if your doctor tells you to do so, and do activities that are safe for you and your baby.  Keep all follow-up visits as told by your doctor. This is important as your doctor will have to ensure that your baby is healthy and growing well. This information is not intended to replace advice given to you by your health care provider. Make sure you discuss any questions you have with your health care provider. Document Released: 12/24/2007 Document Revised: 07/15/2016 Document Reviewed: 07/15/2016 Elsevier Interactive Patient Education  2017 [place]. Common Medications Safe in Pregnancy  Acne:      Constipation:  Benzoyl Peroxide     Colace  Clindamycin      Dulcolax Suppository  Topica Erythromycin     Fibercon  Salicylic  Acid      Metamucil         Miralax AVOID:        Senakot   Accutane    Cough:  Retin-A       Cough Drops  Tetracycline      Phenergan w/ Codeine if Rx  Minocycline      Robitussin (Plain & DM)  Antibiotics:     Crabs/Lice:  Ceclor       RID  Cephalosporins    AVOID:  E-Mycins      Kwell  Keflex  Macrobid/Macrodantin   Diarrhea:  Penicillin      Kao-Pectate  Zithromax      Imodium AD         PUSH FLUIDS AVOID:       Cipro     Fever:  Tetracycline      Tylenol (Regular or Extra  Minocycline       Strength)  Levaquin      Extra Strength-Do not          Exceed 8 tabs/24 hrs Caffeine:        <273m/day (equiv. To 1 cup of coffee or  approx. 3 12 oz sodas)         Gas: Cold/Hayfever:       Gas-X  Benadryl      Mylicon  Claritin       Phazyme  **Claritin-D        Chlor-Trimeton    Headaches:  Dimetapp      ASA-Free Excedrin  Drixoral-Non-Drowsy     Cold Compress  Mucinex (Guaifenasin)     Tylenol (Regular or Extra  Sudafed/Sudafed-12 Hour     Strength)  **Sudafed PE Pseudoephedrine   Tylenol Cold & Sinus     Vicks Vapor Rub  Zyrtec  **AVOID if Problems With Blood Pressure         Heartburn: Avoid lying down for at least 1 hour after meals  Aciphex      Maalox     Rash:  Milk of Magnesia     Benadryl    Mylanta       1% Hydrocortisone Cream  Pepcid  Pepcid Complete   Sleep Aids:  Prevacid  Ambien   Prilosec       Benadryl  Rolaids       Chamomile Tea  Tums (Limit 4/day)     Unisom  Zantac       Tylenol PM         Warm milk-add vanilla or  Hemorrhoids:       Sugar for taste  Anusol/Anusol H.C.  (RX: Analapram 2.5%)  Sugar Substitutes:  Hydrocortisone OTC     Ok in moderation  Preparation H      Tucks        Vaseline lotion applied to tissue with wiping    Herpes:     Throat:  Acyclovir      Oragel  Famvir  Valtrex     Vaccines:         Flu Shot Leg Cramps:       *Gardasil  Benadryl      Hepatitis A         Hepatitis B Nasal  Spray:       Pneumovax  Saline Nasal Spray     Polio Booster         Tetanus Nausea:       Tuberculosis test or PPD  Vitamin B6 25 mg TID   AVOID:    Dramamine      *Gardasil  Emetrol       Live Poliovirus  Ginger Root 250 mg QID    MMR (measles, mumps &  High Complex Carbs @ Bedtime    rebella)  Sea Bands-Accupressure    Varicella (Chickenpox)  Unisom 1/2 tab TID     *No known complications           If received before Pain:         Known pregnancy;   Darvocet       Resume series after  Lortab        Delivery  Percocet    Yeast:   Tramadol      Femstat  Tylenol 3      Gyne-lotrimin  Ultram       Monistat  Vicodin           MISC:         All Sunscreens           Hair Coloring/highlights          Insect Repellant's          (Including DEET)         [place] T[employer] Eating Plan for Pregnant Women While you are pregnant, your body will require additional nutrition to help support your growing baby. It is recommended that you consume:  150 additional calories each day during your first trimester.  300 additional calories each day during your second trimester.  300 additional calories each day during your third trimester.  Eating a healthy, well-balanced diet is very important for your health and for your baby's health. You also have a higher need for some vitamins and minerals, such as folic acid, calcium, iron, and vitamin D. What do I need to know about eating during pregnancy?  Do not try to lose weight or go on a diet during pregnancy.  Choose healthy, nutritious foods. Choose  of a sandwich with a glass of milk instead of a candy bar or a high-calorie sugar-sweetened beverage.  Limit your overall intake of foods that have "empty calories." These are foods that have little nutritional value, such as sweets, desserts, candies, sugar-sweetened beverages, and fried foods.  Eat a variety of foods, especially fruits and vegetables.  Take a prenatal vitamin to help meet the  additional needs during pregnancy, specifically for folic acid, iron, calcium, and vitamin D.  Remember to stay active. Ask your health care provider for exercise recommendations that are specific to you.  Practice good food safety and cleanliness, such as washing your hands before you eat and after you prepare raw meat. This helps to prevent foodborne illnesses, such as listeriosis, that can be very dangerous for your baby. Ask your health care provider for more information about listeriosis. What does 150 extra calories look like? Healthy options for an additional 150 calories each day could be any of the following:  Plain low-fat yogurt (6-8 oz) with  cup of berries.  1 apple with 2 teaspoons of peanut butter.  Cut-up vegetables with  cup of hummus.  Low-fat chocolate milk (8 oz or 1 cup).  1 string cheese with 1 medium orange.   of a peanut butter and jelly sandwich on whole-wheat bread (1 tsp of peanut butter).  For 300 calories, you could eat two of those healthy options each day. What is a healthy amount of weight to gain? The recommended amount of weight for you to gain is based on your pre-pregnancy BMI. If your pre-pregnancy BMI was:  Less than 18 (underweight), you should gain 28-40 lb.  18-24.9 (normal), you should gain 25-35 lb.  25-29.9 (overweight), you should gain 15-25 lb.  Greater than 30 (obese), you should gain 11-20 lb.  What if I am having twins or multiples? Generally, pregnant women who will be having twins or multiples may need to increase their daily calories by 300-600 calories each day. The recommended range for total weight gain is 25-54 lb, depending on your pre-pregnancy BMI. Talk with your health care provider for specific guidance about additional nutritional needs, weight gain, and exercise during your pregnancy. What foods can I eat? Grains Any grains. Try to choose whole grains, such as whole-wheat bread, oatmeal, or brown  rice. Vegetables Any vegetables. Try to eat a variety of colors and types of vegetables to get a full range of vitamins and minerals. Remember to wash your vegetables well before eating. Fruits Any fruits. Try to eat a variety of colors and types of fruit to get a full range of vitamins and minerals. Remember to wash your fruits well before eating. Meats and Other Protein Sources Lean meats, including chicken, [place], fish, and lean cuts of beef, veal, or pork. Make sure that all meats are cooked to "well done." Tofu. Tempeh. Beans. Eggs. Peanut butter and other nut butters. Seafood, such as shrimp, crab, and lobster. If you choose fish, select types that are higher in omega-3 fatty acids, including salmon, herring, mussels, trout, sardines, and pollock. Make sure that all meats are cooked to food-safe temperatures. Dairy Pasteurized milk and milk alternatives. Pasteurized yogurt and pasteurized cheese. Cottage cheese. Sour cream. Beverages Water. Juices that contain 100% fruit juice or vegetable juice. Caffeine-free teas and decaffeinated coffee. Drinks that contain caffeine are okay to drink, but it is better to avoid caffeine. Keep your total caffeine intake to less than 200 mg each day (12 oz of coffee, tea, or soda) or as directed by your health care provider. Condiments Any pasteurized condiments. Sweets and Desserts Any sweets and desserts. Fats and Oils Any fats and oils. The items listed above may not be a complete list of recommended foods or beverages. Contact your dietitian for more options.  What foods are not recommended? Vegetables Unpasteurized (raw) vegetable juices. Fruits Unpasteurized (raw) fruit juices. Meats and Other Protein Sources Cured meats that have nitrates, such as bacon, salami, and hotdogs. Luncheon meats, bologna, or other deli meats (unless they are reheated until they are steaming hot). Refrigerated pate, meat spreads from a meat counter, smoked seafood that  is found in the refrigerated section of a store. Raw fish, such as sushi or sashimi. High mercury content fish, such as tilefish, shark, swordfish, and king mackerel. Raw meats, such as tuna or beef tartare. Undercooked meats and poultry. Make sure that all meats are cooked to food-safe temperatures. Dairy Unpasteurized (raw) milk and any foods that have raw milk in them. Soft cheeses, such as feta, queso blanco, queso fresco, Brie, Camembert cheeses, blue-veined cheeses, and Panela cheese (unless it is made with pasteurized milk, which must be stated on the label). Beverages Alcohol. Sugar-sweetened beverages, such as sodas, teas, or energy drinks. Condiments Homemade fermented foods and drinks, such as pickles, sauerkraut, or kombucha drinks. (Store-bought pasteurized versions of these are okay.) Other Salads that are made in the store, such as ham salad, chicken salad, egg salad, tuna salad, and seafood salad. The items listed above may not be a complete list of foods and beverages to avoid. Contact your dietitian for more information. This information is not intended to replace advice given to you by your health care provider. Make sure you discuss any questions you have with your health care provider. Document Released: 04/21/2014 Document Revised: 12/13/2015 Document Reviewed: 12/20/2013 Elsevier Interactive Patient Education  [employer].

## 2017-06-08 NOTE — Progress Notes (Signed)
GYN ENCOUNTER NOTE  Subjective:       Elizabeth Wolf is a 21 y.o. 691P0000 female here for follow up appointment and results review after repeat dating and viability scan.   Intermittent nausea without vomiting.   Denies difficulty breathing or respiratory distress, chest pain, abdominal pain, vaginal bleeding, dysuria, and leg pain or swelling.     Gynecologic History  No LMP recorded (lmp unknown). Patient is pregnant.  Contraception: none  Last Pap: due.   Obstetric History  OB History  Gravida Para Term Preterm AB Living  1 0 0 0 0 0  SAB TAB Ectopic Multiple Live Births  0 0 0 0      # Outcome Date GA Lbr Len/2nd Weight Sex Delivery Anes PTL Lv  1 Current               Past Medical History:  Diagnosis Date  . ADD (attention deficit disorder)   . Breakthrough bleeding on OCPs   . Nausea & vomiting     Past Surgical History:  Procedure Laterality Date  . WISDOM TOOTH EXTRACTION      Current Outpatient Medications on File Prior to Visit  Medication Sig Dispense Refill  . Prenatal Vit-Fe Fumarate-FA (PRENATAL MULTIVITAMIN) TABS tablet Take 1 tablet daily at 12 noon by mouth.     No current facility-administered medications on file prior to visit.    No Known Allergies  Social History   Socioeconomic History  . Marital status: Married    Spouse name: Not on file  . Number of children: Not on file  . Years of education: Not on file  . Highest education level: Not on file  Social Needs  . Financial resource strain: Not on file  . Food insecurity - worry: Not on file  . Food insecurity - inability: Not on file  . Transportation needs - medical: Not on file  . Transportation needs - non-medical: Not on file  Occupational History  . Not on file  Tobacco Use  . Smoking status: Never Smoker  . Smokeless tobacco: Never Used  Substance and Sexual Activity  . Alcohol use: No  . Drug use: No  . Sexual activity: Yes    Birth control/protection: None   Other Topics Concern  . Not on file  Social History Narrative  . Not on file    Family History  Problem Relation Age of Onset  . Diabetes Mother   . Cancer Paternal Grandfather     The following portions of the patient's history were reviewed and updated as appropriate: allergies, current medications, past family history, past medical history, past social history, past surgical history and problem list.  Review of Systems  Review of Systems - Negative except as noted above History obtained from the patient  Objective:   BP 109/68   Pulse 85   Ht 5\' 1"  (1.549 m)   Wt 179 lb 1.6 oz (81.2 kg)   LMP  (LMP Unknown)   BMI 33.84 kg/m    CONSTITUTIONAL: Well-developed, well-nourished female in no acute distress.   HENT:  Normocephalic, atraumatic.   NECK: Normal range of motion, supple, no masses.     SKIN: Skin is warm and dry. No rash noted. Not diaphoretic.  No erythema. No pallor.  NEUROLGIC: Alert and oriented to person, place, and time.   PSYCHIATRIC: Normal mood and affect. Normal behavior. Normal judgment and thought content.   ULTRASOUND REPORT  Location: [facility] Date of Service:  06/08/17  Indications:  F/U Dating/Viability Findings:  Mason JimSingleton intrauterine pregnancy is visualized with a CRL consistent with 6 3/[redacted] weeks gestation, giving an (U/S) EDD of 01/29/18. The (U/S) EDD should be used to establish EDD of 01/29/18.  FHR: 128 BPM CRL measurement: 6.1 mm Yolk sac and and early anatomy is normal.  Right Ovary measures 1.7 x 1.6 x 1.1 cm and appears WNL.  Left Ovary measures 2.9 x 2.2 x 1.9 cm and appears WNL.  There is no obvious evidence of a corpus luteal cyst. Survey of the adnexa demonstrates no adnexal masses. There is no free peritoneal fluid in the cul de sac.  Impression: 1. 6 3/7 week Viable Singleton Intrauterine pregnancy by U/S. 2. (U/S) EDD should be used to establish EDD of 01/29/18.  Recommendations: 1.Clinical  correlation with the patient's History and Physical Exam. 2. (U/S) EDD should be used to establish EDD of 01/29/18.   Assessment:   1. Missed menses   2. Follow up   Plan:   Ultrasound findings reviewed with patient and spouse, verbalized understanding.   The patient has been given an overview regarding routine prenatal care.  Recommendations regarding diet and safe pregnancy medications were given.  Reviewed red flag symptoms and when to call.   RTC x 3 weeks for Nurse intake.  RTC x 6 weeks for NOB physical or sooner if needed.    Gunnar BullaJenkins Michelle Kallee Nam, CNM [facility], [facility]

## 2017-06-15 ENCOUNTER — Encounter: Payer: Self-pay | Admitting: Certified Nurse Midwife

## 2017-06-22 ENCOUNTER — Encounter: Payer: Self-pay | Admitting: Certified Nurse Midwife

## 2017-06-30 ENCOUNTER — Ambulatory Visit: Payer: BLUE CROSS/BLUE SHIELD | Admitting: Certified Nurse Midwife

## 2017-06-30 VITALS — BP 126/66 | HR 82 | Ht 61.0 in | Wt 179.5 lb

## 2017-06-30 DIAGNOSIS — N926 Irregular menstruation, unspecified: Secondary | ICD-10-CM

## 2017-06-30 DIAGNOSIS — Z113 Encounter for screening for infections with a predominantly sexual mode of transmission: Secondary | ICD-10-CM

## 2017-06-30 NOTE — Progress Notes (Signed)
Elizabeth Wolf presents for NOB nurse interview visit. Pregnancy confirmation done here at [facility]. G-1 .  P-    . Pregnancy education material explained and given. Yes there is cats in the home. NOB labs ordered. (TSH/HbgA1c due to Increased BMI), (sickle cell). HIV labs and Drug screen were explained optional and she did not decline. Drug screen ordered . PNV encouraged. Genetic screening options discussed. Genetic testing: Unsure.  Pt may discuss with provider. Pt. To follow up with provider in _2_ weeks for NOB physical.  All questions answered.

## 2017-07-01 LAB — URINALYSIS, ROUTINE W REFLEX MICROSCOPIC
BILIRUBIN UA: NEGATIVE
Glucose, UA: NEGATIVE
KETONES UA: NEGATIVE
LEUKOCYTES UA: NEGATIVE
Nitrite, UA: NEGATIVE
Protein, UA: NEGATIVE
RBC UA: NEGATIVE
SPEC GRAV UA: 1.018 (ref 1.005–1.030)
UUROB: 1 mg/dL (ref 0.2–1.0)
pH, UA: 6 (ref 5.0–7.5)

## 2017-07-01 LAB — MONITOR DRUG PROFILE 14(MW)
AMPHETAMINE SCREEN URINE: NEGATIVE ng/mL
BARBITURATE SCREEN URINE: NEGATIVE ng/mL
BENZODIAZEPINE SCREEN, URINE: NEGATIVE ng/mL
Buprenorphine, Urine: NEGATIVE ng/mL
CANNABINOIDS UR QL SCN: NEGATIVE ng/mL
COCAINE(METAB.)SCREEN, URINE: NEGATIVE ng/mL
CREATININE(CRT), U: 132.2 mg/dL (ref 20.0–300.0)
Fentanyl, Urine: NEGATIVE pg/mL
MEPERIDINE SCREEN, URINE: NEGATIVE ng/mL
METHADONE SCREEN, URINE: NEGATIVE ng/mL
OXYCODONE+OXYMORPHONE UR QL SCN: NEGATIVE ng/mL
Opiate Scrn, Ur: NEGATIVE ng/mL
PROPOXYPHENE SCREEN URINE: NEGATIVE ng/mL
Ph of Urine: 5.7 (ref 4.5–8.9)
Phencyclidine Qn, Ur: NEGATIVE ng/mL
SPECIFIC GRAVITY: 1.016
Tramadol Screen, Urine: NEGATIVE ng/mL

## 2017-07-01 LAB — CBC WITH DIFFERENTIAL/PLATELET
BASOS ABS: 0 10*3/uL (ref 0.0–0.2)
Basos: 0 %
EOS (ABSOLUTE): 0.1 10*3/uL (ref 0.0–0.4)
Eos: 1 %
Hematocrit: 37.5 % (ref 34.0–46.6)
Hemoglobin: 13.1 g/dL (ref 11.1–15.9)
IMMATURE GRANS (ABS): 0 10*3/uL (ref 0.0–0.1)
IMMATURE GRANULOCYTES: 0 %
LYMPHS: 25 %
Lymphocytes Absolute: 1.9 10*3/uL (ref 0.7–3.1)
MCH: 29 pg (ref 26.6–33.0)
MCHC: 34.9 g/dL (ref 31.5–35.7)
MCV: 83 fL (ref 79–97)
Monocytes Absolute: 0.6 10*3/uL (ref 0.1–0.9)
Monocytes: 8 %
NEUTROS PCT: 66 %
Neutrophils Absolute: 5.2 10*3/uL (ref 1.4–7.0)
PLATELETS: 319 10*3/uL (ref 150–379)
RBC: 4.51 x10E6/uL (ref 3.77–5.28)
RDW: 13.9 % (ref 12.3–15.4)
WBC: 7.8 10*3/uL (ref 3.4–10.8)

## 2017-07-01 LAB — RUBELLA SCREEN: Rubella Antibodies, IGG: <0.9 index — ABNORMAL LOW (ref >0.99)

## 2017-07-01 LAB — ABO AND RH: RH TYPE: POSITIVE

## 2017-07-01 LAB — HEMOGLOBIN A1C
ESTIMATED AVERAGE GLUCOSE: 94 mg/dL
Hgb A1c MFr Bld: 4.9 % (ref 4.8–5.6)

## 2017-07-01 LAB — HEPATITIS B SURFACE ANTIGEN: HEP B S AG: NEGATIVE

## 2017-07-01 LAB — HIV ANTIBODY (ROUTINE TESTING W REFLEX): HIV SCREEN 4TH GENERATION: NONREACTIVE

## 2017-07-01 LAB — TSH: TSH: 2.81 u[IU]/mL (ref 0.450–4.500)

## 2017-07-01 LAB — ANTIBODY SCREEN: ANTIBODY SCREEN: NEGATIVE

## 2017-07-01 LAB — VARICELLA ZOSTER ANTIBODY, IGG

## 2017-07-01 LAB — RPR: RPR Ser Ql: NONREACTIVE

## 2017-07-02 ENCOUNTER — Encounter: Payer: Self-pay | Admitting: Certified Nurse Midwife

## 2017-07-02 LAB — URINE CULTURE

## 2017-07-03 LAB — GC/CHLAMYDIA PROBE AMP
Chlamydia trachomatis, NAA: NEGATIVE
NEISSERIA GONORRHOEAE BY PCR: NEGATIVE

## 2017-07-03 NOTE — Progress Notes (Signed)
I have reviewed record and concur with patient management and plan of care.    Breonna Gafford Michelle Kylani Wires, CNM [facility], [facility] 

## 2017-07-06 ENCOUNTER — Other Ambulatory Visit: Payer: Self-pay | Admitting: Obstetrics and Gynecology

## 2017-07-06 DIAGNOSIS — O09899 Supervision of other high risk pregnancies, unspecified trimester: Secondary | ICD-10-CM

## 2017-07-06 DIAGNOSIS — O9989 Other specified diseases and conditions complicating pregnancy, childbirth and the puerperium: Principal | ICD-10-CM

## 2017-07-06 DIAGNOSIS — Z283 Underimmunization status: Secondary | ICD-10-CM

## 2017-07-09 ENCOUNTER — Encounter: Payer: Self-pay | Admitting: Certified Nurse Midwife

## 2017-07-15 ENCOUNTER — Encounter: Payer: Self-pay | Admitting: Certified Nurse Midwife

## 2017-07-20 ENCOUNTER — Telehealth: Payer: Self-pay | Admitting: Certified Nurse Midwife

## 2017-07-20 ENCOUNTER — Telehealth: Payer: Self-pay

## 2017-07-20 NOTE — Telephone Encounter (Signed)
The patient called and stated that she is experiencing extreme nausea and would like to speak with a nurse inregards to that and also her insurance covering her genetic testing. Pease advise.

## 2017-07-20 NOTE — Telephone Encounter (Signed)
Spoke with pt- advised to call ins company to find out what genetic testing is covered. Also N/V protocal reviewed with pt and she will try Vit B6 and Unisom and let us know how she is doing.

## 2017-07-20 NOTE — Telephone Encounter (Signed)
The patient called and stated that she is experiencing extreme nausea and would like to speak with a nurse inregards to that and also her insurance covering her genetic testing. Pease advise.  °

## 2017-07-21 NOTE — L&D Delivery Note (Signed)
      Delivery Note   Elizabeth Wolf is a 22 y.o. G1P0000 at 6348w1d Estimated Date of Delivery: 01/29/18  PRE-OPERATIVE DIAGNOSIS:  1) 6248w1d pregnancy.   POST-OPERATIVE DIAGNOSIS:  1) 6948w1d pregnancy s/p Vaginal, Spontaneous   Delivery Type: Vaginal, Spontaneous    Delivery Anesthesia: Epidural   Labor Complications:   PROM    ESTIMATED BLOOD LOSS: 250 ml    FINDINGS:   1) female infant, Apgar scores of 6    at 1 minute and 9    at 5 minutes and a birthweight of 106.88  ounces.    2) Nuchal cord: no  3) meconium fluid noted @ delivery  SPECIMENS:   PLACENTA:   Appearance: Intact , 3 vessel cord   Removal: Spontaneous      Disposition:   held per protocol then discarded  DISPOSITION:  Infant to left in stable condition in the delivery room, with L&D personnel and mother,  NARRATIVE SUMMARY: Labor course:  Ms. Elizabeth Wolf is a G1P0000 at 1648w1d who presented for PROM.  She progressed well in labor with 3 doses of Cytotec and pitocin.  She received the appropriate epidural  anesthesia and proceeded to complete dilation. She evidenced adequate maternal expulsive effort during the second stage.  Head presented OA and rotated to LOT for delivery of the anterior then posterior shoulder with ease. She went on to deliver a viable female infant "Elizabeth Wolf". The placenta delivered via shultz without problems and was noted to be complete. A perineal and vaginal examination was performed. Episiotomy/Lacerations: 1st degree   Laceration was repaired with 3-0 Vicryl Rapide suture resulting in good hemostatsis. The patient tolerated this well.  Doreene Burkennie Shyah Cadmus, CNM/[facility]  01/23/2018 11:34 PM

## 2017-07-22 NOTE — Telephone Encounter (Signed)
See telephone encounter by [facility] [facility]

## 2017-07-23 ENCOUNTER — Ambulatory Visit (INDEPENDENT_AMBULATORY_CARE_PROVIDER_SITE_OTHER): Payer: BLUE CROSS/BLUE SHIELD | Admitting: Certified Nurse Midwife

## 2017-07-23 VITALS — BP 118/79 | HR 95 | Wt 176.0 lb

## 2017-07-23 DIAGNOSIS — Z3400 Encounter for supervision of normal first pregnancy, unspecified trimester: Secondary | ICD-10-CM

## 2017-07-23 NOTE — Patient Instructions (Signed)
Eating Plan for Pregnant Women While you are pregnant, your body will require additional nutrition to help support your growing baby. It is recommended that you consume:  150 additional calories each day during your first trimester.  300 additional calories each day during your second trimester.  300 additional calories each day during your third trimester.  Eating a healthy, well-balanced diet is very important for your health and for your baby's health. You also have a higher need for some vitamins and minerals, such as folic acid, calcium, iron, and vitamin D. What do I need to know about eating during pregnancy?  Do not try to lose weight or go on a diet during pregnancy.  Choose healthy, nutritious foods. Choose  of a sandwich with a glass of milk instead of a candy bar or a high-calorie sugar-sweetened beverage.  Limit your overall intake of foods that have "empty calories." These are foods that have little nutritional value, such as sweets, desserts, candies, sugar-sweetened beverages, and fried foods.  Eat a variety of foods, especially fruits and vegetables.  Take a prenatal vitamin to help meet the additional needs during pregnancy, specifically for folic acid, iron, calcium, and vitamin D.  Remember to stay active. Ask your health care provider for exercise recommendations that are specific to you.  Practice good food safety and cleanliness, such as washing your hands before you eat and after you prepare raw meat. This helps to prevent foodborne illnesses, such as listeriosis, that can be very dangerous for your baby. Ask your health care provider for more information about listeriosis. What does 150 extra calories look like? Healthy options for an additional 150 calories each day could be any of the following:  Plain low-fat yogurt (6-8 oz) with  cup of berries.  1 apple with 2 teaspoons of peanut butter.  Cut-up vegetables with  cup of hummus.  Low-fat chocolate milk  (8 oz or 1 cup).  1 string cheese with 1 medium orange.   of a peanut butter and jelly sandwich on whole-wheat bread (1 tsp of peanut butter).  For 300 calories, you could eat two of those healthy options each day. What is a healthy amount of weight to gain? The recommended amount of weight for you to gain is based on your pre-pregnancy BMI. If your pre-pregnancy BMI was:  Less than 18 (underweight), you should gain 28-40 lb.  18-24.9 (normal), you should gain 25-35 lb.  25-29.9 (overweight), you should gain 15-25 lb.  Greater than 30 (obese), you should gain 11-20 lb.  What if I am having twins or multiples? Generally, pregnant women who will be having twins or multiples may need to increase their daily calories by 300-600 calories each day. The recommended range for total weight gain is 25-54 lb, depending on your pre-pregnancy BMI. Talk with your health care provider for specific guidance about additional nutritional needs, weight gain, and exercise during your pregnancy. What foods can I eat? Grains Any grains. Try to choose whole grains, such as whole-wheat bread, oatmeal, or brown rice. Vegetables Any vegetables. Try to eat a variety of colors and types of vegetables to get a full range of vitamins and minerals. Remember to wash your vegetables well before eating. Fruits Any fruits. Try to eat a variety of colors and types of fruit to get a full range of vitamins and minerals. Remember to wash your fruits well before eating. Meats and Other Protein Sources Lean meats, including chicken, [place], fish, and lean cuts of beef, veal,  or pork. Make sure that all meats are cooked to "well done." Tofu. Tempeh. Beans. Eggs. Peanut butter and other nut butters. Seafood, such as shrimp, crab, and lobster. If you choose fish, select types that are higher in omega-3 fatty acids, including salmon, herring, mussels, trout, sardines, and pollock. Make sure that all meats are cooked to food-safe  temperatures. Dairy Pasteurized milk and milk alternatives. Pasteurized yogurt and pasteurized cheese. Cottage cheese. Sour cream. Beverages Water. Juices that contain 100% fruit juice or vegetable juice. Caffeine-free teas and decaffeinated coffee. Drinks that contain caffeine are okay to drink, but it is better to avoid caffeine. Keep your total caffeine intake to less than 200 mg each day (12 oz of coffee, tea, or soda) or as directed by your health care provider. Condiments Any pasteurized condiments. Sweets and Desserts Any sweets and desserts. Fats and Oils Any fats and oils. The items listed above may not be a complete list of recommended foods or beverages. Contact your dietitian for more options. What foods are not recommended? Vegetables Unpasteurized (raw) vegetable juices. Fruits Unpasteurized (raw) fruit juices. Meats and Other Protein Sources Cured meats that have nitrates, such as bacon, salami, and hotdogs. Luncheon meats, bologna, or other deli meats (unless they are reheated until they are steaming hot). Refrigerated pate, meat spreads from a meat counter, smoked seafood that is found in the refrigerated section of a store. Raw fish, such as sushi or sashimi. High mercury content fish, such as tilefish, shark, swordfish, and king mackerel. Raw meats, such as tuna or beef tartare. Undercooked meats and poultry. Make sure that all meats are cooked to food-safe temperatures. Dairy Unpasteurized (raw) milk and any foods that have raw milk in them. Soft cheeses, such as feta, queso blanco, queso fresco, Brie, Camembert cheeses, blue-veined cheeses, and Panela cheese (unless it is made with pasteurized milk, which must be stated on the label). Beverages Alcohol. Sugar-sweetened beverages, such as sodas, teas, or energy drinks. Condiments Homemade fermented foods and drinks, such as pickles, sauerkraut, or kombucha drinks. (Store-bought pasteurized versions of these are  okay.) Other Salads that are made in the store, such as ham salad, chicken salad, egg salad, tuna salad, and seafood salad. The items listed above may not be a complete list of foods and beverages to avoid. Contact your dietitian for more information. This information is not intended to replace advice given to you by your health care provider. Make sure you discuss any questions you have with your health care provider. Document Released: 04/21/2014 Document Revised: 12/13/2015 Document Reviewed: 12/20/2013 Elsevier Interactive Patient Education  2018 [employer]. WHAT OB PATIENTS CAN EXPECT   Confirmation of pregnancy and ultrasound ordered if medically indicated-[redacted] weeks gestation  New OB (NOB) intake with nurse and New OB (NOB) labs- [redacted] weeks gestation  New OB (NOB) physical examination with provider- 11/[redacted] weeks gestation  Flu vaccine-[redacted] weeks gestation  Anatomy scan-[redacted] weeks gestation  Glucose tolerance test, blood work to test for anemia, T-dap vaccine-[redacted] weeks gestation  Vaginal swabs/cultures-STD/Group B strep-[redacted] weeks gestation  Appointments every 4 weeks until 28 weeks  Every 2 weeks from 28 weeks until 36 weeks  Weekly visits from 36 weeks until delivery  Common Medications Safe in Pregnancy  Acne:      Constipation:  Benzoyl Peroxide     Colace  Clindamycin      Dulcolax Suppository  Topica Erythromycin     Fibercon  Salicylic Acid      Metamucil  Miralax AVOID:        Senakot   Accutane    Cough:  Retin-A       Cough Drops  Tetracycline      Phenergan w/ Codeine if Rx  Minocycline      Robitussin (Plain & DM)  Antibiotics:     Crabs/Lice:  Ceclor       RID  Cephalosporins    AVOID:  E-Mycins      Kwell  Keflex  Macrobid/Macrodantin   Diarrhea:  Penicillin      Kao-Pectate  Zithromax      Imodium AD         PUSH FLUIDS AVOID:       Cipro     Fever:  Tetracycline      Tylenol (Regular or Extra  Minocycline       Strength)  Levaquin      Extra  Strength-Do not          Exceed 8 tabs/24 hrs Caffeine:        <274m/day (equiv. To 1 cup of coffee or  approx. 3 12 oz sodas)         Gas: Cold/Hayfever:       Gas-X  Benadryl      Mylicon  Claritin       Phazyme  **Claritin-D        Chlor-Trimeton    Headaches:  Dimetapp      ASA-Free Excedrin  Drixoral-Non-Drowsy     Cold Compress  Mucinex (Guaifenasin)     Tylenol (Regular or Extra  Sudafed/Sudafed-12 Hour     Strength)  **Sudafed PE Pseudoephedrine   Tylenol Cold & Sinus     Vicks Vapor Rub  Zyrtec  **AVOID if Problems With Blood Pressure         Heartburn: Avoid lying down for at least 1 hour after meals  Aciphex      Maalox     Rash:  Milk of Magnesia     Benadryl    Mylanta       1% Hydrocortisone Cream  Pepcid  Pepcid Complete   Sleep Aids:  Prevacid      Ambien   Prilosec       Benadryl  Rolaids       Chamomile Tea  Tums (Limit 4/day)     Unisom  Zantac       Tylenol PM         Warm milk-add vanilla or  Hemorrhoids:       Sugar for taste  Anusol/Anusol H.C.  (RX: Analapram 2.5%)  Sugar Substitutes:  Hydrocortisone OTC     Ok in moderation  Preparation H      Tucks        Vaseline lotion applied to tissue with wiping    Herpes:     Throat:  Acyclovir      Oragel  Famvir  Valtrex     Vaccines:         Flu Shot Leg Cramps:       *Gardasil  Benadryl      Hepatitis A         Hepatitis B Nasal Spray:       Pneumovax  Saline Nasal Spray     Polio Booster         Tetanus Nausea:       Tuberculosis test or PPD  Vitamin B6 25 mg TID   AVOID:    Dramamine      *  Gardasil  Emetrol       Live Poliovirus  Ginger Root 250 mg QID    MMR (measles, mumps &  High Complex Carbs @ Bedtime    rebella)  Sea Bands-Accupressure    Varicella (Chickenpox)  Unisom 1/2 tab TID     *No known complications           If received before Pain:         Known pregnancy;   Darvocet       Resume series  after  Lortab        Delivery  Percocet    Yeast:   Tramadol      Femstat  Tylenol 3      Gyne-lotrimin  Ultram       Monistat  Vicodin           MISC:         All Sunscreens           Hair Coloring/highlights          Insect Repellant's          (Including DEET)         Mystic Tans Back Pain in Pregnancy Back pain during pregnancy is common. Back pain may be caused by several factors that are related to changes during your pregnancy. Follow these instructions at home: Managing pain, stiffness, and swelling  If directed, apply ice for sudden (acute) back pain. ? Put ice in a plastic bag. ? Place a towel between your skin and the bag. ? Leave the ice on for 20 minutes, 2-3 times per day.  If directed, apply heat to the affected area before you exercise: ? Place a towel between your skin and the heat pack or heating pad. ? Leave the heat on for 20-30 minutes. ? Remove the heat if your skin turns bright red. This is especially important if you are unable to feel pain, heat, or cold. You may have a greater risk of getting burned. Activity  Exercise as told by your health care provider. Exercising is the best way to prevent or manage back pain.  Listen to your body when lifting. If lifting hurts, ask for help or bend your knees. This uses your leg muscles instead of your back muscles.  Squat down when picking up something from the floor. Do not bend over.  Only use bed rest as told by your health care provider. Bed rest should only be used for the most severe episodes of back pain. Standing, Sitting, and Lying Down  Do not stand in one place for long periods of time.  Use good posture when sitting. Make sure your head rests over your shoulders and is not hanging forward. Use a pillow on your lower back if necessary.  Try sleeping on your side, preferably the left side, with a pillow or two between your legs. If you are sore after a night's rest, your bed may be too soft. A firm  mattress may provide more support for your back during pregnancy. General instructions  Do not wear high heels.  Eat a healthy diet. Try to gain weight within your health care provider's recommendations.  Use a maternity girdle, elastic sling, or back brace as told by your health care provider.  Take over-the-counter and prescription medicines only as told by your health care provider.  Keep all follow-up visits as told by your health care provider. This is important. This includes any visits with any specialists, such as a  physical therapist. Contact a health care provider if:  Your back pain interferes with your daily activities.  You have increasing pain in other parts of your body. Get help right away if:  You develop numbness, tingling, weakness, or problems with the use of your arms or legs.  You develop severe back pain that is not controlled with medicine.  You have a sudden change in bowel or bladder control.  You develop shortness of breath, dizziness, or you faint.  You develop nausea, vomiting, or sweating.  You have back pain that is a rhythmic, cramping pain similar to labor pains. Labor pain is usually 1-2 minutes apart, lasts for about 1 minute, and involves a bearing down feeling or pressure in your pelvis.  You have back pain and your water breaks or you have vaginal bleeding.  You have back pain or numbness that travels down your leg.  Your back pain developed after you fell.  You develop pain on one side of your back.  You see blood in your urine.  You develop skin blisters in the area of your back pain. This information is not intended to replace advice given to you by your health care provider. Make sure you discuss any questions you have with your health care provider. Document Released: 10/15/2005 Document Revised: 12/13/2015 Document Reviewed: 03/21/2015 Elsevier Interactive Patient Education  2018 [employer]. Round Ligament Pain The round  ligament is a cord of muscle and tissue that helps to support the uterus. It can become a source of pain during pregnancy if it becomes stretched or twisted as the baby grows. The pain usually begins in the second trimester of pregnancy, and it can come and go until the baby is delivered. It is not a serious problem, and it does not cause harm to the baby. Round ligament pain is usually a short, sharp, and pinching pain, but it can also be a dull, lingering, and aching pain. The pain is felt in the lower side of the abdomen or in the groin. It usually starts deep in the groin and moves up to the outside of the hip area. Pain can occur with:  A sudden change in position.  Rolling over in bed.  Coughing or sneezing.  Physical activity.  Follow these instructions at home: Watch your condition for any changes. Take these steps to help with your pain:  When the pain starts, relax. Then try: ? Sitting down. ? Flexing your knees up to your abdomen. ? Lying on your side with one pillow under your abdomen and another pillow between your legs. ? Sitting in a warm bath for 15-20 minutes or until the pain goes away.  Take over-the-counter and prescription medicines only as told by your health care provider.  Move slowly when you sit and stand.  Avoid long walks if they cause pain.  Stop or lessen your physical activities if they cause pain.  Contact a health care provider if:  Your pain does not go away with treatment.  You feel pain in your back that you did not have before.  Your medicine is not helping. Get help right away if:  You develop a fever or chills.  You develop uterine contractions.  You develop vaginal bleeding.  You develop nausea or vomiting.  You develop diarrhea.  You have pain when you urinate. This information is not intended to replace advice given to you by your health care provider. Make sure you discuss any questions you have with your health  care  provider. Document Released: 04/15/2008 Document Revised: 12/13/2015 Document Reviewed: 09/13/2014 Elsevier Interactive Patient Education  2018 [place] of Pregnancy The second trimester is from week 13 through week 28, month 4 through 6. This is often the time in pregnancy that you feel your best. Often times, morning sickness has lessened or quit. You may have more energy, and you may get hungry more often. Your unborn baby (fetus) is growing rapidly. At the end of the sixth month, he or she is about 9 inches long and weighs about 1 pounds. You will likely feel the baby move (quickening) between 18 and 20 weeks of pregnancy. Follow these instructions at home:  Avoid all smoking, herbs, and alcohol. Avoid drugs not approved by your doctor.  Do not use any tobacco products, including cigarettes, chewing tobacco, and electronic cigarettes. If you need help quitting, ask your doctor. You may get counseling or other support to help you quit.  Only take medicine as told by your doctor. Some medicines are safe and some are not during pregnancy.  Exercise only as told by your doctor. Stop exercising if you start having cramps.  Eat regular, healthy meals.  Wear a good support bra if your breasts are tender.  Do not use hot tubs, steam rooms, or saunas.  Wear your seat belt when driving.  Avoid raw meat, uncooked cheese, and liter boxes and soil used by cats.  Take your prenatal vitamins.  Take 1500-2000 milligrams of calcium daily starting at the 20th week of pregnancy until you deliver your baby.  Try taking medicine that helps you poop (stool softener) as needed, and if your doctor approves. Eat more fiber by eating fresh fruit, vegetables, and whole grains. Drink enough fluids to keep your pee (urine) clear or pale yellow.  Take warm water baths (sitz baths) to soothe pain or discomfort caused by hemorrhoids. Use hemorrhoid cream if your doctor approves.  If you  have puffy, bulging veins (varicose veins), wear support hose. Raise (elevate) your feet for 15 minutes, 3-4 times a day. Limit salt in your diet.  Avoid heavy lifting, wear low heals, and sit up straight.  Rest with your legs raised if you have leg cramps or low back pain.  Visit your dentist if you have not gone during your pregnancy. Use a soft toothbrush to brush your teeth. Be gentle when you floss.  You can have sex (intercourse) unless your doctor tells you not to.  Go to your doctor visits. Get help if:  You feel dizzy.  You have mild cramps or pressure in your lower belly (abdomen).  You have a nagging pain in your belly area.  You continue to feel sick to your stomach (nauseous), throw up (vomit), or have watery poop (diarrhea).  You have bad smelling fluid coming from your vagina.  You have pain with peeing (urination). Get help right away if:  You have a fever.  You are leaking fluid from your vagina.  You have spotting or bleeding from your vagina.  You have severe belly cramping or pain.  You lose or gain weight rapidly.  You have trouble catching your breath and have chest pain.  You notice sudden or extreme puffiness (swelling) of your face, hands, ankles, feet, or legs.  You have not felt the baby move in over an hour.  You have severe headaches that do not go away with medicine.  You have vision changes. This information is not intended to replace advice  given to you by your health care provider. Make sure you discuss any questions you have with your health care provider. Document Released: 10/01/2009 Document Revised: 12/13/2015 Document Reviewed: 09/07/2012 Elsevier Interactive Patient Education  2017 [employer] Morning Sickness Morning sickness is when you feel sick to your stomach (nauseous) during pregnancy. You may feel sick to your stomach and throw up (vomit). You may feel sick in the morning, but you can feel this way any time of day.  Some women feel very sick to their stomach and cannot stop throwing up (hyperemesis gravidarum). Follow these instructions at home:  Only take medicines as told by your doctor.  Take multivitamins as told by your doctor. Taking multivitamins before getting pregnant can stop or lessen the harshness of morning sickness.  Eat dry toast or unsalted crackers before getting out of bed.  Eat 5 to 6 small meals a day.  Eat dry and bland foods like rice and baked potatoes.  Do not drink liquids with meals. Drink between meals.  Do not eat greasy, fatty, or spicy foods.  Have someone cook for you if the smell of food causes you to feel sick or throw up.  If you feel sick to your stomach after taking prenatal vitamins, take them at night or with a snack.  Eat protein when you need a snack (nuts, yogurt, cheese).  Eat unsweetened gelatins for dessert.  Wear a bracelet used for sea sickness (acupressure wristband).  Go to a doctor that puts thin needles into certain body points (acupuncture) to improve how you feel.  Do not smoke.  Use a humidifier to keep the air in your house free of odors.  Get lots of fresh air. Contact a doctor if:  You need medicine to feel better.  You feel dizzy or lightheaded.  You are losing weight. Get help right away if:  You feel very sick to your stomach and cannot stop throwing up.  You pass out (faint). This information is not intended to replace advice given to you by your health care provider. Make sure you discuss any questions you have with your health care provider. Document Released: 08/14/2004 Document Revised: 12/13/2015 Document Reviewed: 12/22/2012 Elsevier Interactive Patient Education  2017 [place]; Pyridoxine delayed and extended-release tablets What is this medicine? Doxylamine; pyridoxine (dox IL a meen; peer i DOX een) is a combination of an antihistamine and vitamin B6. The drug is used to treat nausea and  vomiting associated with pregnancy. This medicine may be used for other purposes; ask your health care provider or pharmacist if you have questions. COMMON BRAND NAME(S): Diclegis What should I tell my health care provider before I take this medicine? They need to know if you have any of these conditions: -contact lenses -glaucoma -liver disease -lung or breathing disease, like asthma or emphysema -pain or trouble passing urine -prostate trouble -ulcers or other stomach problems -an unusual or allergic reaction to doxylamine or pyridoxine (vitamin B6), other medicines, foods, dyes, or preservatives -breast-feeding How should I use this medicine? Take this medicine by mouth with a glass of water. Do not cut, crush or chew this medicine. Follow the directions on the package or prescription label. Take this medicine on an empty stomach. Take your medicine at regular intervals. Do not take it more often than directed. Talk to your pediatrician regarding the use of this medicine in children. Special care may be needed. Overdosage: If you think you have taken too much of this medicine contact  a poison control center or emergency room at once. NOTE: This medicine is only for you. Do not share this medicine with others. What if I miss a dose? If you miss a dose, take it as soon as you can. If it is almost time for your next dose, take only that dose. Do not take double or extra doses. What may interact with this medicine? -alcohol -atropine -antihistamines for allergy, cough and cold -certain medicines for bladder problems like oxybutynin, tolterodine -certain medicines for stomach problems like dicyclomine, hyoscyamine -certain medicines for travel sickness like scopolamine -certain medicines for Parkinson's disease like benztropine, trihexyphenidyl -ipratropium -MAOIs like Carbex, Eldepryl, Marplan, Nardil, and Parnate This list may not describe all possible interactions. Give your health  care provider a list of all the medicines, herbs, non-prescription drugs, or dietary supplements you use. Also tell them if you smoke, drink alcohol, or use illegal drugs. Some items may interact with your medicine. What should I watch for while using this medicine? Tell your doctor or healthcare professional if your symptoms do not start to get better or if they get worse. See your doctor right away if you get a high fever or have problems breathing. Your mouth may get dry. Chewing sugarless gum or sucking hard candy, and drinking plenty of water may help. Contact your doctor if the problem does not go away or is severe. This medicine may cause dry eyes and blurred vision. If you wear contact lenses you may feel some discomfort. Lubricating drops may help. See your eye doctor if the problem does not go away or is severe. You may get drowsy or dizzy. Do not drive, use machinery, or do anything that needs mental alertness until you know how this medicine affects you. Do not stand or sit up quickly, especially if you are an older patient. This reduces the risk of dizzy or fainting spells. What side effects may I notice from receiving this medicine? Side effects that you should report to your doctor or health care professional as soon as possible: -allergic reactions like skin rash, itching or hives, swelling of the face, lips, or tongue -changes in vision -confused, agitated, or nervous -fast, irregular heartbeat -feeling faint or lightheaded, falls -muscle or facial twitches -seizure -trouble passing urine or change in the amount of urine Side effects that usually do not require medical attention (report to your doctor or health care professional if they continue or are bothersome): -dry mouth -headache -loss of appetite -stomach upset This list may not describe all possible side effects. Call your doctor for medical advice about side effects. You may report side effects to FDA at  1-800-FDA-1088. Where should I keep my medicine? Keep out of the reach of children. Store at room temperature between 15 and 30 degrees C (59 and 86 degrees F). Keep bottle tightly closed and protect from moisture. Do not remove desiccant canister from bottle. Throw away any unused medicine after the expiration date. NOTE: This sheet is a summary. It may not cover all possible information. If you have questions about this medicine, talk to your doctor, pharmacist, or health care provider.  2018 [employer]vier/Gold Standard (2015-08-09 10:18:26)

## 2017-07-23 NOTE — Progress Notes (Signed)
New OB- Patient reports that she is still vomiting 3x daily. She reports that this has improved. She was vomiting at least 12 times daily. Initially she tried ginger root, and it did not help. She is currently taking Vitamin B6 and Unisom and it has helped.

## 2017-07-24 ENCOUNTER — Encounter: Payer: Self-pay | Admitting: Certified Nurse Midwife

## 2017-07-24 NOTE — Progress Notes (Signed)
NEW OB HISTORY AND PHYSICAL  SUBJECTIVE:       Elizabeth Wolf is a 22 y.o. [id number] female, Patient's last menstrual period was 04/24/2017., Estimated Date of Delivery: 01/29/18, [redacted]w[redacted]d, presents today for establishment of Prenatal Care.  She has no unusual complaints. Endorses nausea with daily vomiting daily. Reports minor relief of symptoms with B6 PO.   Denies difficulty breathing or respiratory distress, chest pain, abdominal pain, vaginal bleeding, dysuria, and leg pain or swelling.   Desires genetic screening.   Gynecologic History  Patient's last menstrual period was 04/24/2017.   Contraception: none  Last Pap: due.   Obstetric History  OB History  Gravida Para Term Preterm AB Living  1 0 0 0 0 0  SAB TAB Ectopic Multiple Live Births  0 0 0 0      # Outcome Date GA Lbr Len/2nd Weight Sex Delivery Anes PTL Lv  1 Current               Past Medical History:  Diagnosis Date  . ADD (attention deficit disorder)   . Breakthrough bleeding on OCPs   . Nausea & vomiting     Past Surgical History:  Procedure Laterality Date  . WISDOM TOOTH EXTRACTION      Current Outpatient Medications on File Prior to Visit  Medication Sig Dispense Refill  . Prenatal Vit-Fe Fumarate-FA (PRENATAL MULTIVITAMIN) TABS tablet Take 1 tablet daily at 12 noon by mouth.     No current facility-administered medications on file prior to visit.     No Known Allergies  Social History   Socioeconomic History  . Marital status: Married    Spouse name: Not on file  . Number of children: Not on file  . Years of education: Not on file  . Highest education level: Not on file  Social Needs  . Financial resource strain: Not on file  . Food insecurity - worry: Not on file  . Food insecurity - inability: Not on file  . Transportation needs - medical: Not on file  . Transportation needs - non-medical: Not on file  Occupational History  . Not on file  Tobacco Use  . Smoking status: Never  Smoker  . Smokeless tobacco: Never Used  Substance and Sexual Activity  . Alcohol use: No  . Drug use: No  . Sexual activity: Yes    Birth control/protection: None  Other Topics Concern  . Not on file  Social History Narrative  . Not on file    Family History  Problem Relation Age of Onset  . Diabetes Mother   . Cancer Paternal Grandfather     The following portions of the patient's history were reviewed and updated as appropriate: allergies, current medications, past OB history, past medical history, past surgical history, past family history, past social history, and problem list.  OBJECTIVE:  BP 118/79   Pulse 95   Wt 176 lb (79.8 kg)   LMP 04/24/2017   BMI 33.25 kg/m   Initial Physical Exam (New OB)  GENERAL APPEARANCE: alert, well appearing, in no apparent distress  HEAD: normocephalic, atraumatic  MOUTH: mucous membranes moist, pharynx normal without lesions and dental hygiene good  THYROID: no thyromegaly or masses present  BREASTS: not examined  LUNGS: clear to auscultation, no wheezes, rales or rhonchi, symmetric air entry  HEART: regular rate and rhythm, no murmurs  ABDOMEN: soft, nontender, nondistended, no abnormal masses, no epigastric pain, obese, fundus not palpable and FHT present  EXTREMITIES: no redness  or tenderness in the calves or thighs, no edema  SKIN: normal coloration and turgor, no rashes  LYMPH NODES: no adenopathy palpable  NEUROLOGIC: alert, oriented, normal speech, no focal findings or movement disorder noted  PELVIC EXAM: pt declined exam  ASSESSMENT: Normal pregnancy Nausea and vomiting in pregnancy Desires genetic screening  PLAN: Prenatal care Education regarding morning sickness management. Samples of Bonjesta given. Pt prefers postpartum Pap. New OB counseling: The patient has been given an overview regarding routine prenatal care. Recommendations regarding diet, weight gain, and exercise in pregnancy were  given. Prenatal testing, optional genetic testing, and ultrasound use in pregnancy were reviewed.  Benefits of Breast Feeding were discussed. The patient is encouraged to consider nursing her baby post partum. See orders   Gunnar BullaJenkins Michelle Kyo Cocuzza, CNM [facility], [facility]

## 2017-07-28 ENCOUNTER — Encounter: Payer: Self-pay | Admitting: Certified Nurse Midwife

## 2017-08-03 ENCOUNTER — Encounter: Payer: Self-pay | Admitting: Certified Nurse Midwife

## 2017-08-21 ENCOUNTER — Encounter: Payer: Self-pay | Admitting: Certified Nurse Midwife

## 2017-08-21 ENCOUNTER — Ambulatory Visit (INDEPENDENT_AMBULATORY_CARE_PROVIDER_SITE_OTHER): Payer: BLUE CROSS/BLUE SHIELD | Admitting: Certified Nurse Midwife

## 2017-08-21 VITALS — BP 89/67 | HR 98 | Wt 178.9 lb

## 2017-08-21 DIAGNOSIS — Z23 Encounter for immunization: Secondary | ICD-10-CM

## 2017-08-21 DIAGNOSIS — Z3402 Encounter for supervision of normal first pregnancy, second trimester: Secondary | ICD-10-CM

## 2017-08-21 LAB — POCT URINALYSIS DIPSTICK
BILIRUBIN UA: NEGATIVE
Blood, UA: NEGATIVE
Glucose, UA: NEGATIVE
KETONES UA: NEGATIVE
Leukocytes, UA: NEGATIVE
Nitrite, UA: NEGATIVE
PH UA: 6 (ref 5.0–8.0)
Protein, UA: NEGATIVE
SPEC GRAV UA: 1.015 (ref 1.010–1.025)
UROBILINOGEN UA: 0.2 U/dL

## 2017-08-21 MED ORDER — DOXYLAMINE-PYRIDOXINE 10-10 MG PO TBEC: 10.0000 mg | DELAYED_RELEASE_TABLET | Freq: Every day | ORAL | 1 refills | Status: DC

## 2017-08-21 NOTE — Progress Notes (Signed)
ROb- c/o of frequent h/a. Gave 1 gram of tylenol. Encouraged po hydration. NO meds tried. Wants rx for diclegis- done. Flu vaccine given.

## 2017-08-21 NOTE — Progress Notes (Signed)
Pt complains of headaches and nausea. Order placed for diclegis , self help measures reviewed for nausea/headaches. She declines perscription for fiorocet at this time. Reviewed red flag symptoms for dehydration. She verbalizes understanding and agrees

## 2017-08-21 NOTE — Patient Instructions (Signed)

## 2017-08-31 ENCOUNTER — Other Ambulatory Visit: Payer: Self-pay | Admitting: Certified Nurse Midwife

## 2017-08-31 DIAGNOSIS — Z3689 Encounter for other specified antenatal screening: Secondary | ICD-10-CM

## 2017-09-14 ENCOUNTER — Ambulatory Visit (INDEPENDENT_AMBULATORY_CARE_PROVIDER_SITE_OTHER): Payer: BLUE CROSS/BLUE SHIELD

## 2017-09-14 ENCOUNTER — Ambulatory Visit (INDEPENDENT_AMBULATORY_CARE_PROVIDER_SITE_OTHER): Payer: BLUE CROSS/BLUE SHIELD | Admitting: Certified Nurse Midwife

## 2017-09-14 VITALS — BP 103/66 | HR 94 | Wt 174.6 lb

## 2017-09-14 DIAGNOSIS — Z3689 Encounter for other specified antenatal screening: Secondary | ICD-10-CM | POA: Diagnosis not present

## 2017-09-14 DIAGNOSIS — Z3401 Encounter for supervision of normal first pregnancy, first trimester: Secondary | ICD-10-CM | POA: Diagnosis not present

## 2017-09-14 LAB — POCT URINALYSIS DIPSTICK
BILIRUBIN UA: NEGATIVE
Glucose, UA: NEGATIVE
KETONES UA: NEGATIVE
Leukocytes, UA: NEGATIVE
NITRITE UA: NEGATIVE
PH UA: 5 (ref 5.0–8.0)
Protein, UA: NEGATIVE
RBC UA: NEGATIVE
Spec Grav, UA: >=1.03 — AB (ref 1.010–1.025)
UROBILINOGEN UA: 0.2 U/dL

## 2017-09-14 NOTE — Patient Instructions (Addendum)
Eating Plan for Hyperemesis Gravidarum Hyperemesis gravidarum is a severe form of morning sickness. Because this condition causes severe nausea and vomiting, it can lead to dehydration, malnutrition, and weight loss. One way to lessen the symptoms of nausea and vomiting is to follow the eating plan for hyperemesis gravidarum. It is often used along with prescribed medicines to control your symptoms. What can I do to relieve my symptoms? Listen to your body. Everyone is different and has different preferences. Find what works best for you. Take any of the following actions that are helpful to you:  Eat and drink slowly.  Eat 5-6 small meals daily instead of 3 large meals.  Eat crackers before you get out of bed in the morning.  Try having a snack in the middle of the night.  Starchy foods are usually tolerated well. Examples include cereal, toast, bread, potatoes, pasta, rice, and pretzels.  Ginger may help with nausea. Add  tsp ground ginger to hot tea or choose ginger tea.  Try drinking 100% fruit juice or an electrolyte drink. An electrolyte drink contains sodium, potassium, and chloride.  Continue to take your prenatal vitamins as told by your health care provider. If you are having trouble taking your prenatal vitamins, talk with your health care provider about different options.  Include at least 1 serving of protein with your meals and snacks. Protein options include meats or poultry, beans, nuts, eggs, and yogurt. Try eating a protein-rich snack before bed. Examples of these snacks include cheese and crackers or half of a peanut butter or [place] sandwich.  Consider eliminating foods that trigger your symptoms. These may include spicy foods, coffee, high-fat foods, very sweet foods, and acidic foods.  Try meals that have more protein combined with bland, salty, lower-fat, and dry foods, such as nuts, seeds, pretzels, crackers, and cereal.  Talk with your healthcare provider about  starting a supplement of vitamin B6.  Have fluids that are cold, clear, and carbonated or sour. Examples include lemonade, ginger ale, lemon-lime soda, ice water, and sparkling water.  Try lemon or mint tea.  Try brushing your teeth or using a mouth rinse after meals.  What should I avoid to reduce my symptoms? Avoiding some of the following things may help reduce your symptoms.  Foods with strong smells. Try eating meals in well-ventilated areas that are free of odors.  Drinking water or other beverages with meals. Try not to drink anything during the 30 minutes before and after your meals.  Drinking more than 1 cup of fluid at a time. Sometimes using a straw helps.  Fried or high-fat foods, such as butter and cream sauces.  Spicy foods.  Skipping meals as best as you can. Nausea can be more intense on an empty stomach. If you cannot tolerate food at that time, do not force it. Try sucking on ice chips or other frozen items, and make up for missed calories later.  Lying down within 2 hours after eating.  Environmental triggers. These may include smoky rooms, closed spaces, rooms with strong smells, warm or humid places, overly loud and noisy rooms, and rooms with motion or flickering lights.  Quick and sudden changes in your movement.  This information is not intended to replace advice given to you by your health care provider. Make sure you discuss any questions you have with your health care provider. Document Released: 05/04/2007 Document Revised: 03/05/2016 Document Reviewed: 02/05/2016 Elsevier Interactive Patient Education  2018 [place] for Pregnant  Women While you are pregnant, your body will require additional nutrition to help support your growing baby. It is recommended that you consume:  150 additional calories each day during your first trimester.  300 additional calories each day during your second trimester.  300 additional calories each day  during your third trimester.  Eating a healthy, well-balanced diet is very important for your health and for your baby's health. You also have a higher need for some vitamins and minerals, such as folic acid, calcium, iron, and vitamin D. What do I need to know about eating during pregnancy?  Do not try to lose weight or go on a diet during pregnancy.  Choose healthy, nutritious foods. Choose  of a sandwich with a glass of milk instead of a candy bar or a high-calorie sugar-sweetened beverage.  Limit your overall intake of foods that have "empty calories." These are foods that have little nutritional value, such as sweets, desserts, candies, sugar-sweetened beverages, and fried foods.  Eat a variety of foods, especially fruits and vegetables.  Take a prenatal vitamin to help meet the additional needs during pregnancy, specifically for folic acid, iron, calcium, and vitamin D.  Remember to stay active. Ask your health care provider for exercise recommendations that are specific to you.  Practice good food safety and cleanliness, such as washing your hands before you eat and after you prepare raw meat. This helps to prevent foodborne illnesses, such as listeriosis, that can be very dangerous for your baby. Ask your health care provider for more information about listeriosis. What does 150 extra calories look like? Healthy options for an additional 150 calories each day could be any of the following:  Plain low-fat yogurt (6-8 oz) with  cup of berries.  1 apple with 2 teaspoons of peanut butter.  Cut-up vegetables with  cup of hummus.  Low-fat chocolate milk (8 oz or 1 cup).  1 string cheese with 1 medium orange.   of a peanut butter and jelly sandwich on whole-wheat bread (1 tsp of peanut butter).  For 300 calories, you could eat two of those healthy options each day. What is a healthy amount of weight to gain? The recommended amount of weight for you to gain is based on your  pre-pregnancy BMI. If your pre-pregnancy BMI was:  Less than 18 (underweight), you should gain 28-40 lb.  18-24.9 (normal), you should gain 25-35 lb.  25-29.9 (overweight), you should gain 15-25 lb.  Greater than 30 (obese), you should gain 11-20 lb.  What if I am having twins or multiples? Generally, pregnant women who will be having twins or multiples may need to increase their daily calories by 300-600 calories each day. The recommended range for total weight gain is 25-54 lb, depending on your pre-pregnancy BMI. Talk with your health care provider for specific guidance about additional nutritional needs, weight gain, and exercise during your pregnancy. What foods can I eat? Grains Any grains. Try to choose whole grains, such as whole-wheat bread, oatmeal, or brown rice. Vegetables Any vegetables. Try to eat a variety of colors and types of vegetables to get a full range of vitamins and minerals. Remember to wash your vegetables well before eating. Fruits Any fruits. Try to eat a variety of colors and types of fruit to get a full range of vitamins and minerals. Remember to wash your fruits well before eating. Meats and Other Protein Sources Lean meats, including chicken, [place], fish, and lean cuts of beef, veal, or pork. Make  sure that all meats are cooked to "well done." Tofu. Tempeh. Beans. Eggs. Peanut butter and other nut butters. Seafood, such as shrimp, crab, and lobster. If you choose fish, select types that are higher in omega-3 fatty acids, including salmon, herring, mussels, trout, sardines, and pollock. Make sure that all meats are cooked to food-safe temperatures. Dairy Pasteurized milk and milk alternatives. Pasteurized yogurt and pasteurized cheese. Cottage cheese. Sour cream. Beverages Water. Juices that contain 100% fruit juice or vegetable juice. Caffeine-free teas and decaffeinated coffee. Drinks that contain caffeine are okay to drink, but it is better to avoid  caffeine. Keep your total caffeine intake to less than 200 mg each day (12 oz of coffee, tea, or soda) or as directed by your health care provider. Condiments Any pasteurized condiments. Sweets and Desserts Any sweets and desserts. Fats and Oils Any fats and oils. The items listed above may not be a complete list of recommended foods or beverages. Contact your dietitian for more options. What foods are not recommended? Vegetables Unpasteurized (raw) vegetable juices. Fruits Unpasteurized (raw) fruit juices. Meats and Other Protein Sources Cured meats that have nitrates, such as bacon, salami, and hotdogs. Luncheon meats, bologna, or other deli meats (unless they are reheated until they are steaming hot). Refrigerated pate, meat spreads from a meat counter, smoked seafood that is found in the refrigerated section of a store. Raw fish, such as sushi or sashimi. High mercury content fish, such as tilefish, shark, swordfish, and king mackerel. Raw meats, such as tuna or beef tartare. Undercooked meats and poultry. Make sure that all meats are cooked to food-safe temperatures. Dairy Unpasteurized (raw) milk and any foods that have raw milk in them. Soft cheeses, such as feta, queso blanco, queso fresco, Brie, Camembert cheeses, blue-veined cheeses, and Panela cheese (unless it is made with pasteurized milk, which must be stated on the label). Beverages Alcohol. Sugar-sweetened beverages, such as sodas, teas, or energy drinks. Condiments Homemade fermented foods and drinks, such as pickles, sauerkraut, or kombucha drinks. (Store-bought pasteurized versions of these are okay.) Other Salads that are made in the store, such as ham salad, chicken salad, egg salad, tuna salad, and seafood salad. The items listed above may not be a complete list of foods and beverages to avoid. Contact your dietitian for more information. This information is not intended to replace advice given to you by your health care  provider. Make sure you discuss any questions you have with your health care provider. Document Released: 04/21/2014 Document Revised: 12/13/2015 Document Reviewed: 12/20/2013 Elsevier Interactive Patient Education  2018 [place]. Back Pain in Pregnancy Back pain during pregnancy is common. Back pain may be caused by several factors that are related to changes during your pregnancy. Follow these instructions at home: Managing pain, stiffness, and swelling  If directed, apply ice for sudden (acute) back pain. ? Put ice in a plastic bag. ? Place a towel between your skin and the bag. ? Leave the ice on for 20 minutes, 2-3 times per day.  If directed, apply heat to the affected area before you exercise: ? Place a towel between your skin and the heat pack or heating pad. ? Leave the heat on for 20-30 minutes. ? Remove the heat if your skin turns bright red. This is especially important if you are unable to feel pain, heat, or cold. You may have a greater risk of getting burned. Activity  Exercise as told by your health care provider. Exercising is the best way  to prevent or manage back pain.  Listen to your body when lifting. If lifting hurts, ask for help or bend your knees. This uses your leg muscles instead of your back muscles.  Squat down when picking up something from the floor. Do not bend over.  Only use bed rest as told by your health care provider. Bed rest should only be used for the most severe episodes of back pain. Standing, Sitting, and Lying Down  Do not stand in one place for long periods of time.  Use good posture when sitting. Make sure your head rests over your shoulders and is not hanging forward. Use a pillow on your lower back if necessary.  Try sleeping on your side, preferably the left side, with a pillow or two between your legs. If you are sore after a night's rest, your bed may be too soft. A firm mattress may provide more support for your back during  pregnancy. General instructions  Do not wear high heels.  Eat a healthy diet. Try to gain weight within your health care provider's recommendations.  Use a maternity girdle, elastic sling, or back brace as told by your health care provider.  Take over-the-counter and prescription medicines only as told by your health care provider.  Keep all follow-up visits as told by your health care provider. This is important. This includes any visits with any specialists, such as a physical therapist. Contact a health care provider if:  Your back pain interferes with your daily activities.  You have increasing pain in other parts of your body. Get help right away if:  You develop numbness, tingling, weakness, or problems with the use of your arms or legs.  You develop severe back pain that is not controlled with medicine.  You have a sudden change in bowel or bladder control.  You develop shortness of breath, dizziness, or you faint.  You develop nausea, vomiting, or sweating.  You have back pain that is a rhythmic, cramping pain similar to labor pains. Labor pain is usually 1-2 minutes apart, lasts for about 1 minute, and involves a bearing down feeling or pressure in your pelvis.  You have back pain and your water breaks or you have vaginal bleeding.  You have back pain or numbness that travels down your leg.  Your back pain developed after you fell.  You develop pain on one side of your back.  You see blood in your urine.  You develop skin blisters in the area of your back pain. This information is not intended to replace advice given to you by your health care provider. Make sure you discuss any questions you have with your health care provider. Document Released: 10/15/2005 Document Revised: 12/13/2015 Document Reviewed: 03/21/2015 Elsevier Interactive Patient Education  2018 [employer]. Round Ligament Pain The round ligament is a cord of muscle and tissue that helps to  support the uterus. It can become a source of pain during pregnancy if it becomes stretched or twisted as the baby grows. The pain usually begins in the second trimester of pregnancy, and it can come and go until the baby is delivered. It is not a serious problem, and it does not cause harm to the baby. Round ligament pain is usually a short, sharp, and pinching pain, but it can also be a dull, lingering, and aching pain. The pain is felt in the lower side of the abdomen or in the groin. It usually starts deep in the groin and moves up  to the outside of the hip area. Pain can occur with:  A sudden change in position.  Rolling over in bed.  Coughing or sneezing.  Physical activity.  Follow these instructions at home: Watch your condition for any changes. Take these steps to help with your pain:  When the pain starts, relax. Then try: ? Sitting down. ? Flexing your knees up to your abdomen. ? Lying on your side with one pillow under your abdomen and another pillow between your legs. ? Sitting in a warm bath for 15-20 minutes or until the pain goes away.  Take over-the-counter and prescription medicines only as told by your health care provider.  Move slowly when you sit and stand.  Avoid long walks if they cause pain.  Stop or lessen your physical activities if they cause pain.  Contact a health care provider if:  Your pain does not go away with treatment.  You feel pain in your back that you did not have before.  Your medicine is not helping. Get help right away if:  You develop a fever or chills.  You develop uterine contractions.  You develop vaginal bleeding.  You develop nausea or vomiting.  You develop diarrhea.  You have pain when you urinate. This information is not intended to replace advice given to you by your health care provider. Make sure you discuss any questions you have with your health care provider. Document Released: 04/15/2008 Document Revised:  12/13/2015 Document Reviewed: 09/13/2014 Elsevier Interactive Patient Education  2018 [employer]. Common Medications Safe in Pregnancy  Acne:      Constipation:  Benzoyl Peroxide     Colace  Clindamycin      Dulcolax Suppository  Topica Erythromycin     Fibercon  Salicylic Acid      Metamucil         Miralax AVOID:        Senakot   Accutane    Cough:  Retin-A       Cough Drops  Tetracycline      Phenergan w/ Codeine if Rx  Minocycline      Robitussin (Plain & DM)  Antibiotics:     Crabs/Lice:  Ceclor       RID  Cephalosporins    AVOID:  E-Mycins      Kwell  Keflex  Macrobid/Macrodantin   Diarrhea:  Penicillin      Kao-Pectate  Zithromax      Imodium AD         PUSH FLUIDS AVOID:       Cipro     Fever:  Tetracycline      Tylenol (Regular or Extra  Minocycline       Strength)  Levaquin      Extra Strength-Do not          Exceed 8 tabs/24 hrs Caffeine:        <253m/day (equiv. To 1 cup of coffee or  approx. 3 12 oz sodas)         Gas: Cold/Hayfever:       Gas-X  Benadryl      Mylicon  Claritin       Phazyme  **Claritin-D        Chlor-Trimeton    Headaches:  Dimetapp      ASA-Free Excedrin  Drixoral-Non-Drowsy     Cold Compress  Mucinex (Guaifenasin)     Tylenol (Regular or Extra  Sudafed/Sudafed-12 Hour     Strength)  **Sudafed PE Pseudoephedrine   Tylenol  Cold & Sinus     Vicks Vapor Rub  Zyrtec  **AVOID if Problems With Blood Pressure         Heartburn: Avoid lying down for at least 1 hour after meals  Aciphex      Maalox     Rash:  Milk of Magnesia     Benadryl    Mylanta       1% Hydrocortisone Cream  Pepcid  Pepcid Complete   Sleep Aids:  Prevacid      Ambien   Prilosec       Benadryl  Rolaids       Chamomile Tea  Tums (Limit 4/day)     Unisom  Zantac       Tylenol PM         Warm milk-add vanilla or  Hemorrhoids:       Sugar for taste  Anusol/Anusol H.C.  (RX: Analapram 2.5%)  Sugar Substitutes:  Hydrocortisone OTC     Ok in  moderation  Preparation H      Tucks        Vaseline lotion applied to tissue with wiping    Herpes:     Throat:  Acyclovir      Oragel  Famvir  Valtrex     Vaccines:         Flu Shot Leg Cramps:       *Gardasil  Benadryl      Hepatitis A         Hepatitis B Nasal Spray:       Pneumovax  Saline Nasal Spray     Polio Booster         Tetanus Nausea:       Tuberculosis test or PPD  Vitamin B6 25 mg TID   AVOID:    Dramamine      *Gardasil  Emetrol       Live Poliovirus  Ginger Root 250 mg QID    MMR (measles, mumps &  High Complex Carbs @ Bedtime    rebella)  Sea Bands-Accupressure    Varicella (Chickenpox)  Unisom 1/2 tab TID     *No known complications           If received before Pain:         Known pregnancy;   Darvocet       Resume series after  Lortab        Delivery  Percocet    Yeast:   Tramadol      Femstat  Tylenol 3      Gyne-lotrimin  Ultram       Monistat  Vicodin           MISC:         All Sunscreens           Hair Coloring/highlights          Insect Repellant's          (Including DEET)         Mystic Tans  Pain Relief During Labor and Delivery Many things can cause pain during labor and delivery, including:  Pressure on bones and ligaments due to the baby moving through the pelvis.  Stretching of tissues due to the baby moving through the birth canal.  Muscle tension due to anxiety or nervousness.  The uterus tightening (contracting) and relaxing to help move the baby.  There are many ways to deal with the pain of labor and delivery. They include:  Taking prenatal  classes. Taking these classes helps you know what to expect during your baby's birth. What you learn will increase your confidence and decrease your anxiety.  Practicing relaxation techniques or doing relaxing activities, such as: ? Focused breathing. ? Meditation. ? Visualization. ? Aroma therapy. ? Listening to your favorite music. ? Hypnosis.  Taking a warm shower or bath  (hydrotherapy). This may: ? Provide comfort and relaxation. ? Lessen your perception of pain. ? Decrease the amount of pain medicine needed. ? Decrease the length of labor.  Getting a massage or counterpressure on your back.  Applying warm packs or ice packs.  Changing positions often, moving around, or using a birthing ball.  Getting: ? Pain medicine through an IV or injection into a muscle. ? Pain medicine inserted into your spinal column. ? Injections of sterile water just under the skin on your lower back (intradermal injections). ? Laughing gas (nitrous oxide).  Discuss your pain control options with your health care provider during your prenatal visits. Explore the options offered by your hospital or birth center. What kinds of medicine are available? There are two kinds of medicines that can be used to relieve pain during labor and delivery:  Analgesics. These medicines decrease pain without causing you to lose feeling or the ability to move your muscles.  Anesthetics. These medicines block feeling in the body and can decrease your ability to move freely.  Both of these kinds of medicine can cause minor side effects, such as nausea, trouble concentrating, and sleepiness. They can also decrease the baby's heart rate before birth and affect the baby's breathing rate after birth. For this reason, health care providers are careful about when and how much medicine is given. What are specific medicines and procedures that provide pain relief? Local Anesthetics Local anesthetics are used to numb a small area of the body. They may be used along with another kind of anesthetic or used to numb the nerves of the vagina, cervix, and perineum during the second stage of labor. General Anesthetics General anesthetics cause you to lose consciousness so you do not feel pain. They are usually only used for an emergency cesarean delivery. General anesthetics are given through an IV tube and a  mask. Pudendal Block A pudendal block is a form of local anesthetic. It may be used to relieve the pain associated with pushing or stretching of the perineum at the time of delivery or to further numb the perineum. A pudendal block is done by injecting numbing medicine through the vaginal wall into a nerve in the pelvis. Epidural Analgesia Epidural analgesia is given through a flexible IV catheter that is inserted into the lower back. Numbing medicine is delivered continuously to the area near your spinal column nerves (epidural space). After having this type of analgesia, you may be able to move your legs but you most likely will not be able to walk. Depending on the amount of medicine given, you may lose all feeling in the lower half of your body, or you may retain some level of sensation, including the urge to push. Epidural analgesia can be used to provide pain relief for a vaginal birth. Spinal Block A spinal block is similar to epidural analgesia, but the medicine is injected into the spinal fluid instead of the epidural space. A spinal block is only given once. It starts to relieve pain quickly, but the pain relief lasts only 1-6 hours. Spinal blocks can be used for cesarean deliveries. Combined Spinal-Epidural (CSE) Block A  CSE block combines the effects of a spinal block and epidural analgesia. The spinal block works quickly to block all pain. The epidural analgesia provides continuous pain relief, even after the effects of the spinal block have worn off. This information is not intended to replace advice given to you by your health care provider. Make sure you discuss any questions you have with your health care provider. Document Released: 10/23/2008 Document Revised: 12/14/2015 Document Reviewed: 11/28/2015 Elsevier Interactive Patient Education  [employer].

## 2017-09-14 NOTE — Progress Notes (Signed)
ROB-Doing well. Anatomy scan today wnl, findings reviewed with patient and spouse. Naming daughter "Carroll KindsCori Grace". Discussed exercise in pregnancy. Reviewed red flag symptoms and when to call. RTC x 4 weeks for ROB or sooner if needed.  ULTRASOUND REPORT  Location: [facility] Date of Service:  09/14/2017  Indications: Anatomy Findings:  Mason JimSingleton intrauterine pregnancy is visualized with FHR at 143 BPM. Biometrics give an (U/S) Gestational age of 24 4/7 weeks and an (U/S) EDD of 01/28/18; this correlates with the clinically established EDD of 01/29/18.  Fetal presentation is variable.  EFW: 355 grams (0lb 13oz). Placenta: Anterior and grade 1. AFI: WNL subjectively.  Anatomic survey is complete and appears WNL; Gender - Female.   Right Ovary measures 2.9 x 2.6 x 2.3 cm. It is normal in appearance. Left Ovary measures 3.1 x 1.7 x 1.9 cm. It is normal appearance. There is no obvious evidence of a corpus luteal cyst. Survey of the adnexa demonstrates no adnexal masses. There is no free peritoneal fluid in the cul de sac.  Impression: 1. 20 4/7 week Viable Singleton Intrauterine pregnancy by U/S. 2. (U/S) EDD is consistent with Clinically established (LMP) EDD of 01/29/18. 3. Normal Anatomy Scan  Recommendations: 1.Clinical correlation with the patient's History and Physical Exam.

## 2017-09-19 ENCOUNTER — Encounter: Payer: Self-pay | Admitting: Certified Nurse Midwife

## 2017-10-13 ENCOUNTER — Ambulatory Visit (INDEPENDENT_AMBULATORY_CARE_PROVIDER_SITE_OTHER): Payer: BLUE CROSS/BLUE SHIELD | Admitting: Certified Nurse Midwife

## 2017-10-13 ENCOUNTER — Encounter: Payer: Self-pay | Admitting: Certified Nurse Midwife

## 2017-10-13 VITALS — BP 105/67 | HR 103 | Wt 175.0 lb

## 2017-10-13 DIAGNOSIS — Z3402 Encounter for supervision of normal first pregnancy, second trimester: Secondary | ICD-10-CM

## 2017-10-13 LAB — POCT URINALYSIS DIPSTICK
Bilirubin, UA: NEGATIVE
Blood, UA: NEGATIVE
KETONES UA: NEGATIVE
LEUKOCYTES UA: NEGATIVE
NITRITE UA: NEGATIVE
PH UA: 6 (ref 5.0–8.0)
PROTEIN UA: NEGATIVE
Spec Grav, UA: 1.025 (ref 1.010–1.025)
UROBILINOGEN UA: 0.2 U/dL

## 2017-10-13 MED ORDER — ONDANSETRON HCL 4 MG PO TABS: 4.0000 mg | ORAL_TABLET | Freq: Three times a day (TID) | ORAL | 0 refills | Status: DC | PRN

## 2017-10-13 NOTE — Patient Instructions (Signed)

## 2017-10-13 NOTE — Progress Notes (Signed)
ROB, doing well . Complains that she is still taking diclegis but that she is have more frequent episodes of nausea and vomiting. Zofran ordered with instructions on use. Reviewed eating small frequent bland meals, avoid triggers for n & v . Pt states that she had soda today , likely cause of sugar in her urine. Reviewed Glucola at next visit. She verbalizes and agrees to plan. She feels good fetal movement. Follow up in 4 wks. '  Doreene BurkeAnnie Xavier Fournier, CNM

## 2017-10-13 NOTE — Progress Notes (Signed)
Pt is here for an ROB visit. 

## 2017-10-30 ENCOUNTER — Encounter: Payer: Self-pay | Admitting: Certified Nurse Midwife

## 2017-10-30 ENCOUNTER — Other Ambulatory Visit: Payer: Self-pay

## 2017-10-30 MED ORDER — ONDANSETRON HCL 4 MG PO TABS: 4.0000 mg | ORAL_TABLET | Freq: Three times a day (TID) | ORAL | 1 refills | Status: DC | PRN

## 2017-11-10 ENCOUNTER — Other Ambulatory Visit: Payer: BLUE CROSS/BLUE SHIELD

## 2017-11-10 ENCOUNTER — Other Ambulatory Visit: Payer: Self-pay | Admitting: Certified Nurse Midwife

## 2017-11-10 ENCOUNTER — Ambulatory Visit (INDEPENDENT_AMBULATORY_CARE_PROVIDER_SITE_OTHER): Payer: BLUE CROSS/BLUE SHIELD | Admitting: Obstetrics and Gynecology

## 2017-11-10 VITALS — BP 109/72 | HR 98 | Wt 180.1 lb

## 2017-11-10 DIAGNOSIS — Z3493 Encounter for supervision of normal pregnancy, unspecified, third trimester: Secondary | ICD-10-CM

## 2017-11-10 DIAGNOSIS — Z23 Encounter for immunization: Secondary | ICD-10-CM

## 2017-11-10 DIAGNOSIS — Z130 Encounter for screening for diseases of the blood and blood-forming organs and certain disorders involving the immune mechanism: Secondary | ICD-10-CM

## 2017-11-10 DIAGNOSIS — Z131 Encounter for screening for diabetes mellitus: Secondary | ICD-10-CM | POA: Diagnosis not present

## 2017-11-10 LAB — POCT URINALYSIS DIPSTICK
Bilirubin, UA: NEGATIVE
Blood, UA: NEGATIVE
GLUCOSE UA: NEGATIVE
KETONES UA: NEGATIVE
Leukocytes, UA: NEGATIVE
Nitrite, UA: NEGATIVE
Protein, UA: NEGATIVE
SPEC GRAV UA: 1.01 (ref 1.010–1.025)
Urobilinogen, UA: 0.2 E.U./dL
pH, UA: 6 (ref 5.0–8.0)

## 2017-11-10 MED ORDER — TETANUS-DIPHTH-ACELL PERTUSSIS 5-2.5-18.5 LF-MCG/0.5 IM SUSP
0.5000 mL | Freq: Once | INTRAMUSCULAR | Status: AC
  Administered 2017-11-10: 0.5 mL via INTRAMUSCULAR

## 2017-11-10 NOTE — Progress Notes (Signed)
ROB- glucola done, blood consent signed, tdap given, pt is doing well 

## 2017-11-10 NOTE — Progress Notes (Signed)
ROB & glucola- waking up at night throwing up acid- will switch back to diclegis. Discussed work - Conservation officer, naturecashier at [employer].  Starts childbirth classes next week.

## 2017-11-10 NOTE — Patient Instructions (Signed)

## 2017-11-11 ENCOUNTER — Encounter: Payer: Self-pay | Admitting: Obstetrics and Gynecology

## 2017-11-11 LAB — CBC
Hematocrit: 32.5 % — ABNORMAL LOW (ref 34.0–46.6)
Hemoglobin: 11 g/dL — ABNORMAL LOW (ref 11.1–15.9)
MCH: 28.9 pg (ref 26.6–33.0)
MCHC: 33.8 g/dL (ref 31.5–35.7)
MCV: 85 fL (ref 79–97)
PLATELETS: 215 10*3/uL (ref 150–379)
RBC: 3.81 x10E6/uL (ref 3.77–5.28)
RDW: 14.1 % (ref 12.3–15.4)
WBC: 9 10*3/uL (ref 3.4–10.8)

## 2017-11-11 LAB — GLUCOSE, 1 HOUR GESTATIONAL: GESTATIONAL DIABETES SCREEN: 156 mg/dL — AB (ref 65–139)

## 2017-11-11 LAB — RPR: RPR: NONREACTIVE

## 2017-11-12 ENCOUNTER — Other Ambulatory Visit: Payer: Self-pay | Admitting: *Deleted

## 2017-11-12 MED ORDER — DOXYLAMINE-PYRIDOXINE 10-10 MG PO TBEC: 10.0000 mg | DELAYED_RELEASE_TABLET | Freq: Every day | ORAL | 1 refills | Status: DC

## 2017-11-24 ENCOUNTER — Encounter: Payer: Self-pay | Admitting: Certified Nurse Midwife

## 2017-11-24 ENCOUNTER — Ambulatory Visit (INDEPENDENT_AMBULATORY_CARE_PROVIDER_SITE_OTHER): Payer: BLUE CROSS/BLUE SHIELD | Admitting: Certified Nurse Midwife

## 2017-11-24 VITALS — BP 93/60 | HR 93 | Wt 177.2 lb

## 2017-11-24 DIAGNOSIS — Z3493 Encounter for supervision of normal pregnancy, unspecified, third trimester: Secondary | ICD-10-CM

## 2017-11-24 LAB — POCT URINALYSIS DIPSTICK
BILIRUBIN UA: NEGATIVE
GLUCOSE UA: NEGATIVE
Ketones, UA: NEGATIVE
Leukocytes, UA: NEGATIVE
Nitrite, UA: NEGATIVE
Protein, UA: NEGATIVE
RBC UA: NEGATIVE
Spec Grav, UA: <=1.005 — AB (ref 1.010–1.025)
Urobilinogen, UA: 0.2 E.U./dL
pH, UA: 6 (ref 5.0–8.0)

## 2017-11-24 NOTE — Progress Notes (Signed)
Pt is here for an ROB visit. States she is still vomiting.

## 2017-11-24 NOTE — Patient Instructions (Signed)

## 2017-11-24 NOTE — Progress Notes (Signed)
ROB, doing well. No complaints . Feels good fetal movement , denies contractions. Discussed 1 hr glucola result and encouraged her make lab appointment ASAP. Reviewed that she will need to be fasting for test. Discussed significance for gestational diabetes and importance of completion of 3 hr test at earliest convenience She verbalizes understanding and agrees to plan. She continues to have occasional vomiting ( 3 times week) and expressed concerned about weight. She has been walking more and eating healthier. Reassurance given. Baby measuring appropriately. Follow up asap for 3 hr GTT and 2 wks for ROB.   Doreene Burke, CNM

## 2017-11-26 ENCOUNTER — Encounter: Payer: Self-pay | Admitting: Certified Nurse Midwife

## 2017-11-26 ENCOUNTER — Ambulatory Visit (INDEPENDENT_AMBULATORY_CARE_PROVIDER_SITE_OTHER): Payer: BLUE CROSS/BLUE SHIELD | Admitting: Certified Nurse Midwife

## 2017-11-26 ENCOUNTER — Other Ambulatory Visit: Payer: BLUE CROSS/BLUE SHIELD

## 2017-11-26 VITALS — BP 103/64 | HR 103 | Wt 181.6 lb

## 2017-11-26 DIAGNOSIS — O2243 Hemorrhoids in pregnancy, third trimester: Secondary | ICD-10-CM | POA: Insufficient documentation

## 2017-11-26 DIAGNOSIS — Z3403 Encounter for supervision of normal first pregnancy, third trimester: Secondary | ICD-10-CM | POA: Diagnosis not present

## 2017-11-26 NOTE — Progress Notes (Signed)
Subjective:   Elizabeth Wolf is a 22 y.o. G1P0000 [redacted]w[redacted]d being seen today for work in problem obstetrical visit.  Patient reports decreased fetal movement despite home treatment measures and three (3) episodes of diarrhea or soft stool with blood present.  No contractions, vaginal bleeding or leaking of fluid.  Reports good fetal movement.  Denies difficulty breathing or respiratory distress, chest pain, abdominal pain, vaginal bleeding, dysuria, and leg pain or swelling.   The following portions of the patient's history were reviewed and updated as appropriate: allergies, current medications, past family history, past medical history, past social history, past surgical history and problem list.   Objective:   BP 103/64   Pulse (!) 103   Wt 181 lb 9.6 oz (82.4 kg)   LMP 04/24/2017   BMI 34.31 kg/m   FHT: Fetal Heart Rate (bpm): 130  Fetal Movement: Movement: Present   Abdomen:  soft, gravid, appropriate for gestational age,non-tender  Rectal:  small, non-thrombosed hemorrhoid present    Assessment:   Pregnancy:  G1P0000 at [redacted]w[redacted]d  1. Encounter for supervision of normal first pregnancy in third trimester  2. Hemorrhoids during pregnancy in third trimester   Plan:   Reactive NST.   Discussed home treatment measures for hemorrhoids in pregnancy; handouts given.   Preterm labor symptoms: vaginal bleeding, contractions and leaking of fluid reviewed in detail.  Fetal movement precautions reviewed.  Follow up as previously scheduled.   Gunnar Bulla, CNM [facility], [facility]

## 2017-11-26 NOTE — Patient Instructions (Signed)
Common Medications Safe in Pregnancy  Acne:      Constipation:  Benzoyl Peroxide     Colace  Clindamycin      Dulcolax Suppository  Topica Erythromycin     Fibercon  Salicylic Acid      Metamucil         Miralax AVOID:        Senakot   Accutane    Cough:  Retin-A       Cough Drops  Tetracycline      Phenergan w/ Codeine if Rx  Minocycline      Robitussin (Plain & DM)  Antibiotics:     Crabs/Lice:  Ceclor       RID  Cephalosporins    AVOID:  E-Mycins      Kwell  Keflex  Macrobid/Macrodantin   Diarrhea:  Penicillin      Kao-Pectate  Zithromax      Imodium AD         PUSH FLUIDS AVOID:       Cipro     Fever:  Tetracycline      Tylenol (Regular or Extra  Minocycline       Strength)  Levaquin      Extra Strength-Do not          Exceed 8 tabs/24 hrs Caffeine:        <200mg/day (equiv. To 1 cup of coffee or  approx. 3 12 oz sodas)         Gas: Cold/Hayfever:       Gas-X  Benadryl      Mylicon  Claritin       Phazyme  **Claritin-D        Chlor-Trimeton    Headaches:  Dimetapp      ASA-Free Excedrin  Drixoral-Non-Drowsy     Cold Compress  Mucinex (Guaifenasin)     Tylenol (Regular or Extra  Sudafed/Sudafed-12 Hour     Strength)  **Sudafed PE Pseudoephedrine   Tylenol Cold & Sinus     Vicks Vapor Rub  Zyrtec  **AVOID if Problems With Blood Pressure         Heartburn: Avoid lying down for at least 1 hour after meals  Aciphex      Maalox     Rash:  Milk of Magnesia     Benadryl    Mylanta       1% Hydrocortisone Cream  Pepcid  Pepcid Complete   Sleep Aids:  Prevacid      Ambien   Prilosec       Benadryl  Rolaids       Chamomile Tea  Tums (Limit 4/day)     Unisom  Zantac       Tylenol PM         Warm milk-add vanilla or  Hemorrhoids:       Sugar for taste  Anusol/Anusol H.C.  (RX: Analapram 2.5%)  Sugar Substitutes:  Hydrocortisone OTC     Ok in moderation  Preparation H      Tucks        Vaseline lotion applied to tissue with  wiping    Herpes:     Throat:  Acyclovir      Oragel  Famvir  Valtrex     Vaccines:         Flu Shot Leg Cramps:       *Gardasil  Benadryl      Hepatitis A         Hepatitis B Nasal Spray:         Pneumovax  Saline Nasal Spray     Polio Booster         Tetanus Nausea:       Tuberculosis test or PPD  Vitamin B6 25 mg TID   AVOID:    Dramamine      *Gardasil  Emetrol       Live Poliovirus  Ginger Root 250 mg QID    MMR (measles, mumps &  High Complex Carbs @ Bedtime    rebella)  Sea Bands-Accupressure    Varicella (Chickenpox)  Unisom 1/2 tab TID     *No known complications           If received before Pain:         Known pregnancy;   Darvocet       Resume series after  Lortab        Delivery  Percocet    Yeast:   Tramadol      Femstat  Tylenol 3      Gyne-lotrimin  Ultram       Monistat  Vicodin           MISC:         All Sunscreens           Hair Coloring/highlights          Insect Repellant's          (Including DEET)         Mystic Tans Fetal Movement Counts Patient Name: ________________________________________________ Patient Due Date: ____________________ What is a fetal movement count? A fetal movement count is the number of times that you feel your baby move during a certain amount of time. This may also be called a fetal kick count. A fetal movement count is recommended for every pregnant woman. You may be asked to start counting fetal movements as early as week 28 of your pregnancy. Pay attention to when your baby is most active. You may notice your baby's sleep and wake cycles. You may also notice things that make your baby move more. You should do a fetal movement count:  When your baby is normally most active.  At the same time each day.  A good time to count movements is while you are resting, after having something to eat and drink. How do I count fetal movements? 1. Find a quiet, comfortable area. Sit, or lie down on your side. 2. Write down the date,  the start time and stop time, and the number of movements that you felt between those two times. Take this information with you to your health care visits. 3. For 2 hours, count kicks, flutters, swishes, rolls, and jabs. You should feel at least 10 movements during 2 hours. 4. You may stop counting after you have felt 10 movements. 5. If you do not feel 10 movements in 2 hours, have something to eat and drink. Then, keep resting and counting for 1 hour. If you feel at least 4 movements during that hour, you may stop counting. Contact a health care provider if:  You feel fewer than 4 movements in 2 hours.  Your baby is not moving like he or she usually does. Date: ____________ Start time: ____________ Stop time: ____________ Movements: ____________ Date: ____________ Start time: ____________ Stop time: ____________ Movements: ____________ Date: ____________ Start time: ____________ Stop time: ____________ Movements: ____________ Date: ____________ Start time: ____________ Stop time: ____________ Movements: ____________ Date: ____________ Start time: ____________ Stop time: ____________ Movements: ____________ Date: ____________ Start time: ____________ Stop   time: ____________ Movements: ____________ Date: ____________ Start time: ____________ Stop time: ____________ Movements: ____________ Date: ____________ Start time: ____________ Stop time: ____________ Movements: ____________ Date: ____________ Start time: ____________ Stop time: ____________ Movements: ____________ This information is not intended to replace advice given to you by your health care provider. Make sure you discuss any questions you have with your health care provider. Document Released: 08/06/2006 Document Revised: 03/05/2016 Document Reviewed: 08/16/2015 Elsevier Interactive Patient Education  2018 [employer]  

## 2017-11-27 ENCOUNTER — Encounter: Payer: Self-pay | Admitting: Certified Nurse Midwife

## 2017-11-30 ENCOUNTER — Other Ambulatory Visit: Payer: BLUE CROSS/BLUE SHIELD

## 2017-12-08 ENCOUNTER — Other Ambulatory Visit: Payer: BLUE CROSS/BLUE SHIELD

## 2017-12-08 ENCOUNTER — Ambulatory Visit (INDEPENDENT_AMBULATORY_CARE_PROVIDER_SITE_OTHER): Payer: BLUE CROSS/BLUE SHIELD | Admitting: Obstetrics and Gynecology

## 2017-12-08 VITALS — BP 101/76 | HR 108 | Wt 177.9 lb

## 2017-12-08 DIAGNOSIS — Z3493 Encounter for supervision of normal pregnancy, unspecified, third trimester: Secondary | ICD-10-CM

## 2017-12-08 DIAGNOSIS — R7309 Other abnormal glucose: Secondary | ICD-10-CM

## 2017-12-08 LAB — POCT URINALYSIS DIPSTICK
Bilirubin, UA: NEGATIVE
Glucose, UA: NEGATIVE
Ketones, UA: NEGATIVE
LEUKOCYTES UA: NEGATIVE
NITRITE UA: NEGATIVE
PH UA: 7.5 (ref 5.0–8.0)
PROTEIN UA: POSITIVE — AB
RBC UA: NEGATIVE
SPEC GRAV UA: 1.01 (ref 1.010–1.025)
UROBILINOGEN UA: 0.2 U/dL

## 2017-12-08 MED ORDER — RANITIDINE HCL 150 MG PO TABS: 150.0000 mg | ORAL_TABLET | Freq: Two times a day (BID) | ORAL | 3 refills | Status: DC

## 2017-12-08 NOTE — Progress Notes (Signed)
ROB- pt is c/o nausea is taking her diclegis

## 2017-12-08 NOTE — Progress Notes (Signed)
ROB- c/o worsening heartburn. Doing 3 hr GTT.

## 2017-12-09 LAB — GESTATIONAL GLUCOSE TOLERANCE
GLUCOSE 1 HOUR GTT: 188 mg/dL — AB (ref 65–179)
GLUCOSE 2 HOUR GTT: 149 mg/dL (ref 65–154)
GLUCOSE FASTING: 80 mg/dL (ref 65–94)
Glucose, GTT - 3 Hour: 124 mg/dL (ref 65–139)

## 2017-12-24 ENCOUNTER — Ambulatory Visit (INDEPENDENT_AMBULATORY_CARE_PROVIDER_SITE_OTHER): Payer: BLUE CROSS/BLUE SHIELD | Admitting: Certified Nurse Midwife

## 2017-12-24 VITALS — BP 90/64 | HR 89 | Wt 179.2 lb

## 2017-12-24 DIAGNOSIS — Z3493 Encounter for supervision of normal pregnancy, unspecified, third trimester: Secondary | ICD-10-CM

## 2017-12-24 LAB — POCT URINALYSIS DIPSTICK
Bilirubin, UA: NEGATIVE
GLUCOSE UA: NEGATIVE
Ketones, UA: NEGATIVE
Leukocytes, UA: NEGATIVE
Nitrite, UA: NEGATIVE
Protein, UA: NEGATIVE
RBC UA: NEGATIVE
Spec Grav, UA: 1.01 (ref 1.010–1.025)
Urobilinogen, UA: 0.2 E.U./dL
pH, UA: 7 (ref 5.0–8.0)

## 2017-12-24 NOTE — Progress Notes (Signed)
Pt is here for an ROB visit. 

## 2017-12-24 NOTE — Progress Notes (Signed)
ROB-Doing well. Sample birth plan given. Plans spouse as labor support and laughing gas/epidural. Discussed home labor preparation techniques. Anticipatory guidance regarding course of prenatal care. Reviewed red flag symptoms and when to call. RTC x 2 weeks for 36 week cultures and ROB or sooner if needed.

## 2017-12-24 NOTE — Patient Instructions (Signed)
Common Medications Safe in Pregnancy  Acne:      Constipation:  Benzoyl Peroxide     Colace  Clindamycin      Dulcolax Suppository  Topica Erythromycin     Fibercon  Salicylic Acid      Metamucil         Miralax AVOID:        Senakot   Accutane    Cough:  Retin-A       Cough Drops  Tetracycline      Phenergan w/ Codeine if Rx  Minocycline      Robitussin (Plain & DM)  Antibiotics:     Crabs/Lice:  Ceclor       RID  Cephalosporins    AVOID:  E-Mycins      Kwell  Keflex  Macrobid/Macrodantin   Diarrhea:  Penicillin      Kao-Pectate  Zithromax      Imodium AD         PUSH FLUIDS AVOID:       Cipro     Fever:  Tetracycline      Tylenol (Regular or Extra  Minocycline       Strength)  Levaquin      Extra Strength-Do not          Exceed 8 tabs/24 hrs Caffeine:        <200mg/day (equiv. To 1 cup of coffee or  approx. 3 12 oz sodas)         Gas: Cold/Hayfever:       Gas-X  Benadryl      Mylicon  Claritin       Phazyme  **Claritin-D        Chlor-Trimeton    Headaches:  Dimetapp      ASA-Free Excedrin  Drixoral-Non-Drowsy     Cold Compress  Mucinex (Guaifenasin)     Tylenol (Regular or Extra  Sudafed/Sudafed-12 Hour     Strength)  **Sudafed PE Pseudoephedrine   Tylenol Cold & Sinus     Vicks Vapor Rub  Zyrtec  **AVOID if Problems With Blood Pressure         Heartburn: Avoid lying down for at least 1 hour after meals  Aciphex      Maalox     Rash:  Milk of Magnesia     Benadryl    Mylanta       1% Hydrocortisone Cream  Pepcid  Pepcid Complete   Sleep Aids:  Prevacid      Ambien   Prilosec       Benadryl  Rolaids       Chamomile Tea  Tums (Limit 4/day)     Unisom  Zantac       Tylenol PM         Warm milk-add vanilla or  Hemorrhoids:       Sugar for taste  Anusol/Anusol H.C.  (RX: Analapram 2.5%)  Sugar Substitutes:  Hydrocortisone OTC     Ok in moderation  Preparation H      Tucks        Vaseline lotion applied to tissue with  wiping    Herpes:     Throat:  Acyclovir      Oragel  Famvir  Valtrex     Vaccines:         Flu Shot Leg Cramps:       *Gardasil  Benadryl      Hepatitis A         Hepatitis B Nasal Spray:         Pneumovax  Saline Nasal Spray     Polio Booster         Tetanus Nausea:       Tuberculosis test or PPD  Vitamin B6 25 mg TID   AVOID:    Dramamine      *Gardasil  Emetrol       Live Poliovirus  Ginger Root 250 mg QID    MMR (measles, mumps &  High Complex Carbs @ Bedtime    rebella)  Sea Bands-Accupressure    Varicella (Chickenpox)  Unisom 1/2 tab TID     *No known complications           If received before Pain:         Known pregnancy;   Darvocet       Resume series after  Lortab        Delivery  Percocet    Yeast:   Tramadol      Femstat  Tylenol 3      Gyne-lotrimin  Ultram       Monistat  Vicodin           MISC:         All Sunscreens           Hair Coloring/highlights          Insect Repellant's          (Including DEET)         Mystic Tans Vaginal Delivery Vaginal delivery means that you will give birth by pushing your baby out of your birth canal (vagina). A team of health care providers will help you before, during, and after vaginal delivery. Birth experiences are unique for every woman and every pregnancy, and birth experiences vary depending on where you choose to give birth. What should I do to prepare for my baby's birth? Before your baby is born, it is important to talk with your health care provider about:  Your labor and delivery preferences. These may include: ? Medicines that you may be given. ? How you will manage your pain. This might include non-medical pain relief techniques or injectable pain relief such as epidural analgesia. ? How you and your baby will be monitored during labor and delivery. ? Who may be in the labor and delivery room with you. ? Your feelings about surgical delivery of your baby (cesarean delivery, or C-section) if this becomes  necessary. ? Your feelings about receiving donated blood through an IV tube (blood transfusion) if this becomes necessary.  Whether you are able: ? To take pictures or videos of the birth. ? To eat during labor and delivery. ? To move around, walk, or change positions during labor and delivery.  What to expect after your baby is born, such as: ? Whether delayed umbilical cord clamping and cutting is offered. ? Who will care for your baby right after birth. ? Medicines or tests that may be recommended for your baby. ? Whether breastfeeding is supported in your hospital or birth center. ? How long you will be in the hospital or birth center.  How any medical conditions you have may affect your baby or your labor and delivery experience.  To prepare for your baby's birth, you should also:  Attend all of your health care visits before delivery (prenatal visits) as recommended by your health care provider. This is important.  Prepare your home for your baby's arrival. Make sure that you have: ? Diapers. ? Baby clothing. ? Feeding equipment. ? Safe   sleeping arrangements for you and your baby.  Install a car seat in your vehicle. Have your car seat checked by a certified car seat installer to make sure that it is installed safely.  Think about who will help you with your new baby at home for at least the first several weeks after delivery.  What can I expect when I arrive at the birth center or hospital? Once you are in labor and have been admitted into the hospital or birth center, your health care provider may:  Review your pregnancy history and any concerns you have.  Insert an IV tube into one of your veins. This is used to give you fluids and medicines.  Check your blood pressure, pulse, temperature, and heart rate (vital signs).  Check whether your bag of water (amniotic sac) has broken (ruptured).  Talk with you about your birth plan and discuss pain control  options.  Monitoring Your health care provider may monitor your contractions (uterine monitoring) and your baby's heart rate (fetal monitoring). You may need to be monitored:  Often, but not continuously (intermittently).  All the time or for long periods at a time (continuously). Continuous monitoring may be needed if: ? You are taking certain medicines, such as medicine to relieve pain or make your contractions stronger. ? You have pregnancy or labor complications.  Monitoring may be done by:  Placing a special stethoscope or a handheld monitoring device on your abdomen to check your baby's heartbeat, and feeling your abdomen for contractions. This method of monitoring does not continuously record your baby's heartbeat or your contractions.  Placing monitors on your abdomen (external monitors) to record your baby's heartbeat and the frequency and length of contractions. You may not have to wear external monitors all the time.  Placing monitors inside of your uterus (internal monitors) to record your baby's heartbeat and the frequency, length, and strength of your contractions. ? Your health care provider may use internal monitors if he or she needs more information about the strength of your contractions or your baby's heart rate. ? Internal monitors are put in place by passing a thin, flexible wire through your vagina and into your uterus. Depending on the type of monitor, it may remain in your uterus or on your baby's head until birth. ? Your health care provider will discuss the benefits and risks of internal monitoring with you and will ask for your permission before inserting the monitors.  Telemetry. This is a type of continuous monitoring that can be done with external or internal monitors. Instead of having to stay in bed, you are able to move around during telemetry. Ask your health care provider if telemetry is an option for you.  Physical exam Your health care provider may  perform a physical exam. This may include:  Checking whether your baby is positioned: ? With the head toward your vagina (head-down). This is most common. ? With the head toward the top of your uterus (head-up or breech). If your baby is in a breech position, your health care provider may try to turn your baby to a head-down position so you can deliver vaginally. If it does not seem that your baby can be born vaginally, your provider may recommend surgery to deliver your baby. In rare cases, you may be able to deliver vaginally if your baby is head-up (breech delivery). ? Lying sideways (transverse). Babies that are lying sideways cannot be delivered vaginally.  Checking your cervix to determine: ? Whether it   is thinning out (effacing). ? Whether it is opening up (dilating). ? How low your baby has moved into your birth canal.  What are the three stages of labor and delivery?  Normal labor and delivery is divided into the following three stages: Stage 1  Stage 1 is the longest stage of labor, and it can last for hours or days. Stage 1 includes: ? Early labor. This is when contractions may be irregular, or regular and mild. Generally, early labor contractions are more than 10 minutes apart. ? Active labor. This is when contractions get longer, more regular, more frequent, and more intense. ? The transition phase. This is when contractions happen very close together, are very intense, and may last longer than during any other part of labor.  Contractions generally feel mild, infrequent, and irregular at first. They get stronger, more frequent (about every 2-3 minutes), and more regular as you progress from early labor through active labor and transition.  Many women progress through stage 1 naturally, but you may need help to continue making progress. If this happens, your health care provider may talk with you about: ? Rupturing your amniotic sac if it has not ruptured yet. ? Giving you  medicine to help make your contractions stronger and more frequent.  Stage 1 ends when your cervix is completely dilated to 4 inches (10 cm) and completely effaced. This happens at the end of the transition phase. Stage 2  Once your cervix is completely effaced and dilated to 4 inches (10 cm), you may start to feel an urge to push. It is common for the body to naturally take a rest before feeling the urge to push, especially if you received an epidural or certain other pain medicines. This rest period may last for up to 1-2 hours, depending on your unique labor experience.  During stage 2, contractions are generally less painful, because pushing helps relieve contraction pain. Instead of contraction pain, you may feel stretching and burning pain, especially when the widest part of your baby's head passes through the vaginal opening (crowning).  Your health care provider will closely monitor your pushing progress and your baby's progress through the vagina during stage 2.  Your health care provider may massage the area of skin between your vaginal opening and anus (perineum) or apply warm compresses to your perineum. This helps it stretch as the baby's head starts to crown, which can help prevent perineal tearing. ? In some cases, an incision may be made in your perineum (episiotomy) to allow the baby to pass through the vaginal opening. An episiotomy helps to make the opening of the vagina larger to allow more room for the baby to fit through.  It is very important to breathe and focus so your health care provider can control the delivery of your baby's head. Your health care provider may have you decrease the intensity of your pushing, to help prevent perineal tearing.  After delivery of your baby's head, the shoulders and the rest of the body generally deliver very quickly and without difficulty.  Once your baby is delivered, the umbilical cord may be cut right away, or this may be delayed for  1-2 minutes, depending on your baby's health. This may vary among health care providers, hospitals, and birth centers.  If you and your baby are healthy enough, your baby may be placed on your chest or abdomen to help maintain the baby's temperature and to help you bond with each other. Some mothers and   babies start breastfeeding at this time. Your health care team will dry your baby and help keep your baby warm during this time.  Your baby may need immediate care if he or she: ? Showed signs of distress during labor. ? Has a medical condition. ? Was born too early (prematurely). ? Had a bowel movement before birth (meconium). ? Shows signs of difficulty transitioning from being inside the uterus to being outside of the uterus. If you are planning to breastfeed, your health care team will help you begin a feeding. Stage 3  The third stage of labor starts immediately after the birth of your baby and ends after you deliver the placenta. The placenta is an organ that develops during pregnancy to provide oxygen and nutrients to your baby in the womb.  Delivering the placenta may require some pushing, and you may have mild contractions. Breastfeeding can stimulate contractions to help you deliver the placenta.  After the placenta is delivered, your uterus should tighten (contract) and become firm. This helps to stop bleeding in your uterus. To help your uterus contract and to control bleeding, your health care provider may: ? Give you medicine by injection, through an IV tube, by mouth, or through your rectum (rectally). ? Massage your abdomen or perform a vaginal exam to remove any blood clots that are left in your uterus. ? Empty your bladder by placing a thin, flexible tube (catheter) into your bladder. ? Encourage you to breastfeed your baby. After labor is over, you and your baby will be monitored closely to ensure that you are both healthy until you are ready to go home. Your health care team  will teach you how to care for yourself and your baby. This information is not intended to replace advice given to you by your health care provider. Make sure you discuss any questions you have with your health care provider. Document Released: 04/15/2008 Document Revised: 01/25/2016 Document Reviewed: 07/22/2015 Elsevier Interactive Patient Education  2018 [employer].

## 2017-12-28 ENCOUNTER — Encounter: Payer: Self-pay | Admitting: Certified Nurse Midwife

## 2017-12-29 ENCOUNTER — Other Ambulatory Visit: Payer: Self-pay

## 2017-12-29 MED ORDER — DOXYLAMINE-PYRIDOXINE 10-10 MG PO TBEC: DELAYED_RELEASE_TABLET | ORAL | 1 refills | Status: DC

## 2018-01-08 ENCOUNTER — Ambulatory Visit (INDEPENDENT_AMBULATORY_CARE_PROVIDER_SITE_OTHER): Payer: BLUE CROSS/BLUE SHIELD | Admitting: Certified Nurse Midwife

## 2018-01-08 VITALS — BP 103/82 | HR 93 | Wt 181.5 lb

## 2018-01-08 DIAGNOSIS — Z3493 Encounter for supervision of normal pregnancy, unspecified, third trimester: Secondary | ICD-10-CM

## 2018-01-08 LAB — POCT URINALYSIS DIPSTICK
Bilirubin, UA: NEGATIVE
GLUCOSE UA: NEGATIVE
KETONES UA: NEGATIVE
Leukocytes, UA: NEGATIVE
NITRITE UA: NEGATIVE
Protein, UA: NEGATIVE
RBC UA: NEGATIVE
SPEC GRAV UA: 1.01 (ref 1.010–1.025)
Urobilinogen, UA: 0.2 E.U./dL
pH, UA: 6.5 (ref 5.0–8.0)

## 2018-01-08 NOTE — Progress Notes (Signed)
Pt is here for an ROB visit and is due for cultures. 

## 2018-01-08 NOTE — Progress Notes (Signed)
ROB doing well. Feels good movement. GBS and cultures today. Labor precautions reviewed. Follow up 1 wk.   Doreene BurkeAnnie Eliazar Olivar, CNM

## 2018-01-08 NOTE — Patient Instructions (Signed)
Braxton Hicks Contractions °Contractions of the uterus can occur throughout pregnancy, but they are not always a sign that you are in labor. You may have practice contractions called Braxton Hicks contractions. These false labor contractions are sometimes confused with true labor. °What are Braxton Hicks contractions? °Braxton Hicks contractions are tightening movements that occur in the muscles of the uterus before labor. Unlike true labor contractions, these contractions do not result in opening (dilation) and thinning of the cervix. Toward the end of pregnancy (32-34 weeks), Braxton Hicks contractions can happen more often and may become stronger. These contractions are sometimes difficult to tell apart from true labor because they can be very uncomfortable. You should not feel embarrassed if you go to the hospital with false labor. °Sometimes, the only way to tell if you are in true labor is for your health care provider to look for changes in the cervix. The health care provider will do a physical exam and may monitor your contractions. If you are not in true labor, the exam should show that your cervix is not dilating and your water has not broken. °If there are other health problems associated with your pregnancy, it is completely safe for you to be sent home with false labor. You may continue to have Braxton Hicks contractions until you go into true labor. °How to tell the difference between true labor and false labor °True labor °· Contractions last 30-70 seconds. °· Contractions become very regular. °· Discomfort is usually felt in the top of the uterus, and it spreads to the lower abdomen and low back. °· Contractions do not go away with walking. °· Contractions usually become more intense and increase in frequency. °· The cervix dilates and gets thinner. °False labor °· Contractions are usually shorter and not as strong as true labor contractions. °· Contractions are usually irregular. °· Contractions  are often felt in the front of the lower abdomen and in the groin. °· Contractions may go away when you walk around or change positions while lying down. °· Contractions get weaker and are shorter-lasting as time goes on. °· The cervix usually does not dilate or become thin. °Follow these instructions at home: °· Take over-the-counter and prescription medicines only as told by your health care provider. °· Keep up with your usual exercises and follow other instructions from your health care provider. °· Eat and drink lightly if you think you are going into labor. °· If Braxton Hicks contractions are making you uncomfortable: °? Change your position from lying down or resting to walking, or change from walking to resting. °? Sit and rest in a tub of warm water. °? Drink enough fluid to keep your urine pale yellow. Dehydration may cause these contractions. °? Do slow and deep breathing several times an hour. °· Keep all follow-up prenatal visits as told by your health care provider. This is important. °Contact a health care provider if: °· You have a fever. °· You have continuous pain in your abdomen. °Get help right away if: °· Your contractions become stronger, more regular, and closer together. °· You have fluid leaking or gushing from your vagina. °· You pass blood-tinged mucus (bloody show). °· You have bleeding from your vagina. °· You have low back pain that you never had before. °· You feel your baby’s head pushing down and causing pelvic pressure. °· Your baby is not moving inside you as much as it used to. °Summary °· Contractions that occur before labor are called Braxton   Hicks contractions, false labor, or practice contractions. °· Braxton Hicks contractions are usually shorter, weaker, farther apart, and less regular than true labor contractions. True labor contractions usually become progressively stronger and regular and they become more frequent. °· Manage discomfort from Braxton Hicks contractions by  changing position, resting in a warm bath, drinking plenty of water, or practicing deep breathing. °This information is not intended to replace advice given to you by your health care provider. Make sure you discuss any questions you have with your health care provider. °Document Released: 11/20/2016 Document Revised: 11/20/2016 Document Reviewed: 11/20/2016 °Elsevier Interactive Patient Education © 2018 [employer] ° °

## 2018-01-10 ENCOUNTER — Encounter (INDEPENDENT_AMBULATORY_CARE_PROVIDER_SITE_OTHER): Payer: Self-pay

## 2018-01-10 LAB — STREP GP B NAA: STREP GROUP B AG: NEGATIVE

## 2018-01-11 LAB — GC/CHLAMYDIA PROBE AMP
CHLAMYDIA, DNA PROBE: NEGATIVE
NEISSERIA GONORRHOEAE BY PCR: NEGATIVE

## 2018-01-14 ENCOUNTER — Encounter: Payer: BLUE CROSS/BLUE SHIELD | Admitting: Certified Nurse Midwife

## 2018-01-14 ENCOUNTER — Ambulatory Visit (INDEPENDENT_AMBULATORY_CARE_PROVIDER_SITE_OTHER): Payer: BLUE CROSS/BLUE SHIELD | Admitting: Certified Nurse Midwife

## 2018-01-14 VITALS — BP 118/78 | HR 99 | Wt 182.4 lb

## 2018-01-14 DIAGNOSIS — Z3493 Encounter for supervision of normal pregnancy, unspecified, third trimester: Secondary | ICD-10-CM

## 2018-01-14 LAB — POCT URINALYSIS DIPSTICK
BILIRUBIN UA: NEGATIVE
Blood, UA: NEGATIVE
Glucose, UA: NEGATIVE
Ketones, UA: NEGATIVE
Leukocytes, UA: NEGATIVE
NITRITE UA: NEGATIVE
PROTEIN UA: NEGATIVE
Spec Grav, UA: 1.02 (ref 1.010–1.025)
UROBILINOGEN UA: 0.2 U/dL
pH, UA: 6.5 (ref 5.0–8.0)

## 2018-01-14 NOTE — Progress Notes (Signed)
Pt is here for an ROB visit. 

## 2018-01-14 NOTE — Patient Instructions (Signed)
Fetal Movement Counts Patient Name: ________________________________________________ Patient Due Date: ____________________ What is a fetal movement count? A fetal movement count is the number of times that you feel your baby move during a certain amount of time. This may also be called a fetal kick count. A fetal movement count is recommended for every pregnant woman. You may be asked to start counting fetal movements as early as week 28 of your pregnancy. Pay attention to when your baby is most active. You may notice your baby's sleep and wake cycles. You may also notice things that make your baby move more. You should do a fetal movement count:  When your baby is normally most active.  At the same time each day.  A good time to count movements is while you are resting, after having something to eat and drink. How do I count fetal movements? 1. Find a quiet, comfortable area. Sit, or lie down on your side. 2. Write down the date, the start time and stop time, and the number of movements that you felt between those two times. Take this information with you to your health care visits. 3. For 2 hours, count kicks, flutters, swishes, rolls, and jabs. You should feel at least 10 movements during 2 hours. 4. You may stop counting after you have felt 10 movements. 5. If you do not feel 10 movements in 2 hours, have something to eat and drink. Then, keep resting and counting for 1 hour. If you feel at least 4 movements during that hour, you may stop counting. Contact a health care provider if:  You feel fewer than 4 movements in 2 hours.  Your baby is not moving like he or she usually does. Date: ____________ Start time: ____________ Stop time: ____________ Movements: ____________ Date: ____________ Start time: ____________ Stop time: ____________ Movements: ____________ Date: ____________ Start time: ____________ Stop time: ____________ Movements: ____________ Date: ____________ Start time:  ____________ Stop time: ____________ Movements: ____________ Date: ____________ Start time: ____________ Stop time: ____________ Movements: ____________ Date: ____________ Start time: ____________ Stop time: ____________ Movements: ____________ Date: ____________ Start time: ____________ Stop time: ____________ Movements: ____________ Date: ____________ Start time: ____________ Stop time: ____________ Movements: ____________ Date: ____________ Start time: ____________ Stop time: ____________ Movements: ____________ This information is not intended to replace advice given to you by your health care provider. Make sure you discuss any questions you have with your health care provider. Document Released: 08/06/2006 Document Revised: 03/05/2016 Document Reviewed: 08/16/2015 Elsevier Interactive Patient Education  2018 [employer] Vaginal Delivery Vaginal delivery means that you will give birth by pushing your baby out of your birth canal (vagina). A team of health care providers will help you before, during, and after vaginal delivery. Birth experiences are unique for every woman and every pregnancy, and birth experiences vary depending on where you choose to give birth. What should I do to prepare for my baby's birth? Before your baby is born, it is important to talk with your health care provider about:  Your labor and delivery preferences. These may include: ? Medicines that you may be given. ? How you will manage your pain. This might include non-medical pain relief techniques or injectable pain relief such as epidural analgesia. ? How you and your baby will be monitored during labor and delivery. ? Who may be in the labor and delivery room with you. ? Your feelings about surgical delivery of your baby (cesarean delivery, or C-section) if this becomes necessary. ? Your feelings about receiving donated   blood through an IV tube (blood transfusion) if this becomes necessary.  Whether you are  able: ? To take pictures or videos of the birth. ? To eat during labor and delivery. ? To move around, walk, or change positions during labor and delivery.  What to expect after your baby is born, such as: ? Whether delayed umbilical cord clamping and cutting is offered. ? Who will care for your baby right after birth. ? Medicines or tests that may be recommended for your baby. ? Whether breastfeeding is supported in your hospital or birth center. ? How long you will be in the hospital or birth center.  How any medical conditions you have may affect your baby or your labor and delivery experience.  To prepare for your baby's birth, you should also:  Attend all of your health care visits before delivery (prenatal visits) as recommended by your health care provider. This is important.  Prepare your home for your baby's arrival. Make sure that you have: ? Diapers. ? Baby clothing. ? Feeding equipment. ? Safe sleeping arrangements for you and your baby.  Install a car seat in your vehicle. Have your car seat checked by a certified car seat installer to make sure that it is installed safely.  Think about who will help you with your new baby at home for at least the first several weeks after delivery.  What can I expect when I arrive at the birth center or hospital? Once you are in labor and have been admitted into the hospital or birth center, your health care provider may:  Review your pregnancy history and any concerns you have.  Insert an IV tube into one of your veins. This is used to give you fluids and medicines.  Check your blood pressure, pulse, temperature, and heart rate (vital signs).  Check whether your bag of water (amniotic sac) has broken (ruptured).  Talk with you about your birth plan and discuss pain control options.  Monitoring Your health care provider may monitor your contractions (uterine monitoring) and your baby's heart rate (fetal monitoring). You may  need to be monitored:  Often, but not continuously (intermittently).  All the time or for long periods at a time (continuously). Continuous monitoring may be needed if: ? You are taking certain medicines, such as medicine to relieve pain or make your contractions stronger. ? You have pregnancy or labor complications.  Monitoring may be done by:  Placing a special stethoscope or a handheld monitoring device on your abdomen to check your baby's heartbeat, and feeling your abdomen for contractions. This method of monitoring does not continuously record your baby's heartbeat or your contractions.  Placing monitors on your abdomen (external monitors) to record your baby's heartbeat and the frequency and length of contractions. You may not have to wear external monitors all the time.  Placing monitors inside of your uterus (internal monitors) to record your baby's heartbeat and the frequency, length, and strength of your contractions. ? Your health care provider may use internal monitors if he or she needs more information about the strength of your contractions or your baby's heart rate. ? Internal monitors are put in place by passing a thin, flexible wire through your vagina and into your uterus. Depending on the type of monitor, it may remain in your uterus or on your baby's head until birth. ? Your health care provider will discuss the benefits and risks of internal monitoring with you and will ask for your permission before inserting  the monitors.  Telemetry. This is a type of continuous monitoring that can be done with external or internal monitors. Instead of having to stay in bed, you are able to move around during telemetry. Ask your health care provider if telemetry is an option for you.  Physical exam Your health care provider may perform a physical exam. This may include:  Checking whether your baby is positioned: ? With the head toward your vagina (head-down). This is most  common. ? With the head toward the top of your uterus (head-up or breech). If your baby is in a breech position, your health care provider may try to turn your baby to a head-down position so you can deliver vaginally. If it does not seem that your baby can be born vaginally, your provider may recommend surgery to deliver your baby. In rare cases, you may be able to deliver vaginally if your baby is head-up (breech delivery). ? Lying sideways (transverse). Babies that are lying sideways cannot be delivered vaginally.  Checking your cervix to determine: ? Whether it is thinning out (effacing). ? Whether it is opening up (dilating). ? How low your baby has moved into your birth canal.  What are the three stages of labor and delivery?  Normal labor and delivery is divided into the following three stages: Stage 1  Stage 1 is the longest stage of labor, and it can last for hours or days. Stage 1 includes: ? Early labor. This is when contractions may be irregular, or regular and mild. Generally, early labor contractions are more than 10 minutes apart. ? Active labor. This is when contractions get longer, more regular, more frequent, and more intense. ? The transition phase. This is when contractions happen very close together, are very intense, and may last longer than during any other part of labor.  Contractions generally feel mild, infrequent, and irregular at first. They get stronger, more frequent (about every 2-3 minutes), and more regular as you progress from early labor through active labor and transition.  Many women progress through stage 1 naturally, but you may need help to continue making progress. If this happens, your health care provider may talk with you about: ? Rupturing your amniotic sac if it has not ruptured yet. ? Giving you medicine to help make your contractions stronger and more frequent.  Stage 1 ends when your cervix is completely dilated to 4 inches (10 cm) and  completely effaced. This happens at the end of the transition phase. Stage 2  Once your cervix is completely effaced and dilated to 4 inches (10 cm), you may start to feel an urge to push. It is common for the body to naturally take a rest before feeling the urge to push, especially if you received an epidural or certain other pain medicines. This rest period may last for up to 1-2 hours, depending on your unique labor experience.  During stage 2, contractions are generally less painful, because pushing helps relieve contraction pain. Instead of contraction pain, you may feel stretching and burning pain, especially when the widest part of your baby's head passes through the vaginal opening (crowning).  Your health care provider will closely monitor your pushing progress and your baby's progress through the vagina during stage 2.  Your health care provider may massage the area of skin between your vaginal opening and anus (perineum) or apply warm compresses to your perineum. This helps it stretch as the baby's head starts to crown, which can help prevent perineal  tearing. ? In some cases, an incision may be made in your perineum (episiotomy) to allow the baby to pass through the vaginal opening. An episiotomy helps to make the opening of the vagina larger to allow more room for the baby to fit through.  It is very important to breathe and focus so your health care provider can control the delivery of your baby's head. Your health care provider may have you decrease the intensity of your pushing, to help prevent perineal tearing.  After delivery of your baby's head, the shoulders and the rest of the body generally deliver very quickly and without difficulty.  Once your baby is delivered, the umbilical cord may be cut right away, or this may be delayed for 1-2 minutes, depending on your baby's health. This may vary among health care providers, hospitals, and birth centers.  If you and your baby are  healthy enough, your baby may be placed on your chest or abdomen to help maintain the baby's temperature and to help you bond with each other. Some mothers and babies start breastfeeding at this time. Your health care team will dry your baby and help keep your baby warm during this time.  Your baby may need immediate care if he or she: ? Showed signs of distress during labor. ? Has a medical condition. ? Was born too early (prematurely). ? Had a bowel movement before birth (meconium). ? Shows signs of difficulty transitioning from being inside the uterus to being outside of the uterus. If you are planning to breastfeed, your health care team will help you begin a feeding. Stage 3  The third stage of labor starts immediately after the birth of your baby and ends after you deliver the placenta. The placenta is an organ that develops during pregnancy to provide oxygen and nutrients to your baby in the womb.  Delivering the placenta may require some pushing, and you may have mild contractions. Breastfeeding can stimulate contractions to help you deliver the placenta.  After the placenta is delivered, your uterus should tighten (contract) and become firm. This helps to stop bleeding in your uterus. To help your uterus contract and to control bleeding, your health care provider may: ? Give you medicine by injection, through an IV tube, by mouth, or through your rectum (rectally). ? Massage your abdomen or perform a vaginal exam to remove any blood clots that are left in your uterus. ? Empty your bladder by placing a thin, flexible tube (catheter) into your bladder. ? Encourage you to breastfeed your baby. After labor is over, you and your baby will be monitored closely to ensure that you are both healthy until you are ready to go home. Your health care team will teach you how to care for yourself and your baby. This information is not intended to replace advice given to you by your health care  provider. Make sure you discuss any questions you have with your health care provider. Document Released: 04/15/2008 Document Revised: 01/25/2016 Document Reviewed: 07/22/2015 Elsevier Interactive Patient Education  2018 [employer].

## 2018-01-19 NOTE — Progress Notes (Signed)
ROB-Doing well, no questions or concerns. Requests SVE, no change. Reviewed red flag symptoms and when to call. RTC x 1 week for ROB or sooner if needed.

## 2018-01-23 ENCOUNTER — Inpatient Hospital Stay: Payer: BLUE CROSS/BLUE SHIELD | Admitting: Anesthesiology

## 2018-01-23 ENCOUNTER — Inpatient Hospital Stay
Admission: EM | Admit: 2018-01-23 | Discharge: 2018-01-25 | DRG: 806 | Disposition: A | Discharge status: Home / Self Care | Payer: BLUE CROSS/BLUE SHIELD | Attending: Certified Nurse Midwife | Admitting: Certified Nurse Midwife

## 2018-01-23 ENCOUNTER — Other Ambulatory Visit: Payer: Self-pay

## 2018-01-23 DIAGNOSIS — O2243 Hemorrhoids in pregnancy, third trimester: Secondary | ICD-10-CM | POA: Diagnosis present

## 2018-01-23 DIAGNOSIS — O4292 Full-term premature rupture of membranes, unspecified as to length of time between rupture and onset of labor: Secondary | ICD-10-CM | POA: Diagnosis present

## 2018-01-23 DIAGNOSIS — Z3A39 39 weeks gestation of pregnancy: Secondary | ICD-10-CM

## 2018-01-23 DIAGNOSIS — O4202 Full-term premature rupture of membranes, onset of labor within 24 hours of rupture: Secondary | ICD-10-CM | POA: Diagnosis not present

## 2018-01-23 LAB — CBC
HCT: 33.1 % — ABNORMAL LOW (ref 35.0–47.0)
Hemoglobin: 11.4 g/dL — ABNORMAL LOW (ref 12.0–16.0)
MCH: 27.6 pg (ref 26.0–34.0)
MCHC: 34.5 g/dL (ref 32.0–36.0)
MCV: 80.1 fL (ref 80.0–100.0)
Platelets: 255 10*3/uL (ref 150–440)
RBC: 4.13 MIL/uL (ref 3.80–5.20)
RDW: 15.1 % — AB (ref 11.5–14.5)
WBC: 9.9 10*3/uL (ref 3.6–11.0)

## 2018-01-23 LAB — ROM PLUS (ARMC ONLY): Rom Plus: POSITIVE

## 2018-01-23 LAB — TYPE AND SCREEN
ABO/RH(D): B POS
ANTIBODY SCREEN: NEGATIVE

## 2018-01-23 MED ORDER — MISOPROSTOL 200 MCG PO TABS
ORAL_TABLET | ORAL | Status: AC
  Administered 2018-01-23: 50 ug via VAGINAL
  Filled 2018-01-23: qty 4

## 2018-01-23 MED ORDER — OXYTOCIN 10 UNIT/ML IJ SOLN: 10.0000 [IU] | Freq: Once | INTRAMUSCULAR | Status: DC

## 2018-01-23 MED ORDER — OXYTOCIN BOLUS FROM INFUSION
500.0000 mL | Freq: Once | INTRAVENOUS | Status: AC
  Administered 2018-01-23: 500 mL via INTRAVENOUS

## 2018-01-23 MED ORDER — TERBUTALINE SULFATE 1 MG/ML IJ SOLN: 0.2500 mg | Freq: Once | INTRAMUSCULAR | Status: DC | PRN

## 2018-01-23 MED ORDER — EPHEDRINE 5 MG/ML INJ
10.0000 mg | INTRAVENOUS | Status: DC | PRN
  Filled 2018-01-23: qty 2

## 2018-01-23 MED ORDER — ONDANSETRON HCL 4 MG/2ML IJ SOLN
4.0000 mg | Freq: Four times a day (QID) | INTRAMUSCULAR | Status: DC | PRN
  Administered 2018-01-23: 4 mg via INTRAVENOUS
  Filled 2018-01-23: qty 2

## 2018-01-23 MED ORDER — SODIUM CHLORIDE 0.9 % IV SOLN
INTRAVENOUS | Status: DC | PRN
  Administered 2018-01-23 (×2): 5 mL via EPIDURAL

## 2018-01-23 MED ORDER — FENTANYL 2.5 MCG/ML W/ROPIVACAINE 0.15% IN NS 100 ML EPIDURAL (ARMC)
12.0000 mL/h | EPIDURAL | Status: DC
  Administered 2018-01-23: 12 mL/h via EPIDURAL
  Filled 2018-01-23: qty 100

## 2018-01-23 MED ORDER — BUTORPHANOL TARTRATE 1 MG/ML IJ SOLN: 1.0000 mg | INTRAMUSCULAR | Status: DC | PRN

## 2018-01-23 MED ORDER — SODIUM CHLORIDE 0.9 % IJ SOLN
INTRAMUSCULAR | Status: AC
  Filled 2018-01-23: qty 50

## 2018-01-23 MED ORDER — LIDOCAINE HCL (PF) 1 % IJ SOLN: 30.0000 mL | INTRAMUSCULAR | Status: DC | PRN

## 2018-01-23 MED ORDER — OXYTOCIN 40 UNITS IN LACTATED RINGERS INFUSION - SIMPLE MED
1.0000 m[IU]/min | INTRAVENOUS | Status: DC
  Administered 2018-01-23: 2 m[IU]/min via INTRAVENOUS

## 2018-01-23 MED ORDER — LIDOCAINE-EPINEPHRINE (PF) 1.5 %-1:200000 IJ SOLN
INTRAMUSCULAR | Status: DC | PRN
  Administered 2018-01-23: 3 mL via EPIDURAL

## 2018-01-23 MED ORDER — FAMOTIDINE 20 MG PO TABS
ORAL_TABLET | ORAL | Status: AC
  Administered 2018-01-23: 20 mg via ORAL
  Filled 2018-01-23: qty 1

## 2018-01-23 MED ORDER — LIDOCAINE HCL (PF) 1 % IJ SOLN
INTRAMUSCULAR | Status: AC
  Filled 2018-01-23: qty 30

## 2018-01-23 MED ORDER — MISOPROSTOL 25 MCG QUARTER TABLET
50.0000 ug | ORAL_TABLET | ORAL | Status: DC | PRN
  Administered 2018-01-23 (×3): 50 ug via VAGINAL
  Filled 2018-01-23 (×3): qty 1

## 2018-01-23 MED ORDER — DIPHENHYDRAMINE HCL 50 MG/ML IJ SOLN: 12.5000 mg | INTRAMUSCULAR | Status: DC | PRN

## 2018-01-23 MED ORDER — AMMONIA AROMATIC IN INHA
RESPIRATORY_TRACT | Status: AC
  Filled 2018-01-23: qty 10

## 2018-01-23 MED ORDER — LACTATED RINGERS IV SOLN: 500.0000 mL | Freq: Once | INTRAVENOUS | Status: DC

## 2018-01-23 MED ORDER — ACETAMINOPHEN 325 MG PO TABS: 650.0000 mg | ORAL_TABLET | ORAL | Status: DC | PRN

## 2018-01-23 MED ORDER — SODIUM CHLORIDE FLUSH 0.9 % IV SOLN
INTRAVENOUS | Status: AC
  Filled 2018-01-23: qty 40

## 2018-01-23 MED ORDER — LACTATED RINGERS IV SOLN
500.0000 mL | INTRAVENOUS | Status: DC | PRN
  Administered 2018-01-23: 500 mL via INTRAVENOUS

## 2018-01-23 MED ORDER — OXYTOCIN 40 UNITS IN LACTATED RINGERS INFUSION - SIMPLE MED
2.5000 [IU]/h | INTRAVENOUS | Status: DC
  Filled 2018-01-23: qty 1000

## 2018-01-23 MED ORDER — LIDOCAINE HCL (PF) 1 % IJ SOLN
INTRAMUSCULAR | Status: DC | PRN
  Administered 2018-01-23: 4 mL via SUBCUTANEOUS

## 2018-01-23 MED ORDER — PHENYLEPHRINE 40 MCG/ML (10ML) SYRINGE FOR IV PUSH (FOR BLOOD PRESSURE SUPPORT)
80.0000 ug | PREFILLED_SYRINGE | INTRAVENOUS | Status: DC | PRN
  Filled 2018-01-23: qty 5

## 2018-01-23 MED ORDER — SOD CITRATE-CITRIC ACID 500-334 MG/5ML PO SOLN: 30.0000 mL | ORAL | Status: DC | PRN

## 2018-01-23 MED ORDER — OXYTOCIN 10 UNIT/ML IJ SOLN
INTRAMUSCULAR | Status: AC
  Filled 2018-01-23: qty 2

## 2018-01-23 MED ORDER — FENTANYL 2.5 MCG/ML W/ROPIVACAINE 0.15% IN NS 100 ML EPIDURAL (ARMC)
EPIDURAL | Status: AC
  Filled 2018-01-23: qty 100

## 2018-01-23 MED ORDER — FENTANYL 2.5 MCG/ML W/ROPIVACAINE 0.15% IN NS 100 ML EPIDURAL (ARMC)
EPIDURAL | Status: DC | PRN
  Administered 2018-01-23: 12 mL/h via EPIDURAL

## 2018-01-23 MED ORDER — FAMOTIDINE 20 MG PO TABS
20.0000 mg | ORAL_TABLET | Freq: Two times a day (BID) | ORAL | Status: DC
  Administered 2018-01-23 – 2018-01-24 (×3): 20 mg via ORAL
  Filled 2018-01-23 (×3): qty 1

## 2018-01-23 MED ORDER — LACTATED RINGERS IV SOLN
INTRAVENOUS | Status: DC
  Administered 2018-01-23 (×3): via INTRAVENOUS

## 2018-01-23 NOTE — H&P (Signed)
History and Physical   HPI  Elizabeth Wolf is a 22 y.o. G1P0000 at 2513w1d Estimated Date of Delivery: 01/29/18 who is being admitted for PROM. She presented approximately 0030 with complaints of leaking fluid that started at midnight. She felt good fetal movement and was having mild irregular contractions 2/10. She denied vaginal bleeding. ROM plus was positive. Clear fluid seen.    OB History  OB History  Gravida Para Term Preterm AB Living  1 0 0 0 0 0  SAB TAB Ectopic Multiple Live Births  0 0 0 0 0    # Outcome Date GA Lbr Len/2nd Weight Sex Delivery Anes PTL Lv  1 Current             PROBLEM LIST  Pregnancy complications or risks: Patient Active Problem List   Diagnosis Date Noted  . Labor and delivery, indication for care 01/23/2018  . Hemorrhoids during pregnancy in third trimester 11/26/2017  . Rubella non-immune status, antepartum 07/06/2017  . Maternal varicella, non-immune 07/06/2017    Prenatal labs and studies: ABO, Rh: --/--/B POS (07/06 0446) Antibody: NEG (07/06 0446) Rubella: <0.90 (12/11 1553) RPR: Non Reactive (04/23 0909)  HBsAg: Negative (12/11 1553)  HIV: Non Reactive (12/11 1553)  ZOX:WRUEAVWUGBS:Negative (06/21 [id number])   Past Medical History:  Diagnosis Date  . ADD (attention deficit disorder)   . Breakthrough bleeding on OCPs   . Nausea & vomiting      Past Surgical History:  Procedure Laterality Date  . WISDOM TOOTH EXTRACTION       Medications    Current Discharge Medication List    CONTINUE these medications which have NOT CHANGED   Details  Doxylamine-Pyridoxine 10-10 MG TBEC 1 in the am, 1 mid morning, 1  Mid afternoon, 1 at bedtime. No more than 4 a day. Qty: 120 tablet, Refills: 1    EVENING PRIMROSE OIL PO Take by mouth.    Prenatal Vit-Fe Fumarate-FA (PRENATAL MULTIVITAMIN) TABS tablet Take 1 tablet daily at 12 noon by mouth.    ranitidine (ZANTAC) 150 MG tablet Take 1 tablet (150 mg total) by mouth 2 (two) times daily. Qty:  60 tablet, Refills: 3         Allergies  Patient has no known allergies.  Review of Systems  Constitutional: negative Eyes: negative Ears, nose, mouth, throat, and face: negative Respiratory: negative Cardiovascular: negative Gastrointestinal: negative Genitourinary:negative Integument/breast: negative Hematologic/lymphatic: negative Musculoskeletal:negative Neurological: negative Behavioral/Psych: negative Endocrine: negative Allergic/Immunologic: negative  Physical Exam  BP 117/71 (BP Location: Left Arm)   Pulse 92   Temp 98.7 F (37.1 C) (Oral)   Resp 16   Ht 5\' 1"  (1.549 m)   Wt 182 lb (82.6 kg)   LMP 04/24/2017   BMI 34.39 kg/m   Lungs:  CTA B Cardio: RRR  Abd: Soft, gravid, NT Presentation: cephalic EXT: No C/C/ no edema CERVIX: Dilation: 1 Effacement (%): 70 Station: -3 Exam by:: A. Janee Mornhompson, CNM   See Prenatal records for more detailed PE.     FHR:  Baseline: 135 bpm, Variability: Good {> 6 bpm), Accelerations: Reactive and Decelerations: Absent  Toco: Uterine Contractions: Frequency: Every 3-5 minutes, Duration: 50-80 seconds and Intensity: mild   Test Results  Results for orders placed or performed during the hospital encounter of 01/23/18 (from the past 24 hour(s))  ROM Plus ([facility] only)     Status: None   Collection Time: 01/23/18  1:44 AM  Result Value Ref Range   Rom Plus POSITIVE  Type and screen [facility]     Status: None   Collection Time: 01/23/18  4:46 AM  Result Value Ref Range   ABO/RH(D) B POS    Antibody Screen NEG    Sample Expiration      01/26/2018 Performed at [facility], [address] [place], [place] [address]   CBC     Status: Abnormal   Collection Time: 01/23/18  4:46 AM  Result Value Ref Range   WBC 9.9 3.6 - 11.0 K/uL   RBC 4.13 3.80 - 5.20 MIL/uL   Hemoglobin 11.4 (L) 12.0 - 16.0 g/dL   HCT 60.4 (L) 54.0 - 98.1 %   MCV 80.1 80.0 - 100.0 fL   MCH 27.6 26.0 - 34.0  pg   MCHC 34.5 32.0 - 36.0 g/dL   RDW 19.1 (H) 47.8 - 29.5 %   Platelets 255 150 - 440 K/uL   Group B Strep negative  Assessment   G1P0000 at [redacted]w[redacted]d Estimated Date of Delivery: 01/29/18  The fetus is reassuring.   Patient Active Problem List   Diagnosis Date Noted  . Labor and delivery, indication for care 01/23/2018  . Hemorrhoids during pregnancy in third trimester 11/26/2017  . Rubella non-immune status, antepartum 07/06/2017  . Maternal varicella, non-immune 07/06/2017    Plan  1. Admitted to L&D :   cytotec induction 2. EFM:-- Category 1 3. Epidural if desired.  Stadol for IV pain until epidural requested. 4. Admission labs completed 5. Foley bulb placed  Doreene Burke, [place]  01/23/2018 9:18 AM

## 2018-01-23 NOTE — OB Triage Note (Signed)
Pt reports sitting upright in bed, then felt a gush and leaking fluid which has continued since midnight, clear. Mild abdominal cramps since leaking fluid, rates 2/10. GBS neg. Cervix checked at 37wk and 38wks, 1cm.

## 2018-01-23 NOTE — Progress Notes (Signed)
LABOR NOTE   Elizabeth Wolf 22 y.o.GP@ at 8075w1d PROM and Prolonged latent labor.  SUBJECTIVE:   Patient is resting comfortably with epidural infusing. OBJECTIVE:  BP (!) 103/57   Pulse 77   Temp (!) 97.5 F (36.4 C) (Oral)   Resp 16   Ht 5\' 1"  (1.549 m)   Wt 182 lb (82.6 kg)   LMP 04/24/2017   SpO2 100%   BMI 34.39 kg/m  Total I/O In: 641.7 [I.V.:641.7] Out: -   She has shown cervical change. CERVIX: 4:  70%:   -1:   mid position:   Soft  IUPC placed SVE:   Dilation: 4 Effacement (%): 70, 80 Station: -1 Exam by:: Shanika   CONTRACTIONS: irregular, every 2-4 minutes 40-70 seconds FHR: Fetal heart tracing reviewed. Baseline: 135 bpm, Variability: Good {> 6 bpm), Accelerations: Reactive and Decelerations: Early Category I Analgesia: Epidural  Labs: Lab Results  Component Value Date   WBC 9.9 01/23/2018   HGB 11.4 (L) 01/23/2018   HCT 33.1 (L) 01/23/2018   MCV 80.1 01/23/2018   PLT 255 01/23/2018    ASSESSMENT: 1) Labor curve reviewed.       Progress: PROM and Prolonged latent labor.     Membranes: clear fluid      Active Problems:   Labor and delivery, indication for care   PLAN: IV Pitocin augmentation and placed IUPC titrate Pitocin to 200-250 MVU  Shanika Creacy,SNM/Darric Plante,CNM 01/23/2018 5:16 PM

## 2018-01-23 NOTE — Anesthesia Procedure Notes (Signed)
Epidural Patient location during procedure: OB Start time: 01/23/2018 2:02 PM End time: 01/23/2018 2:10 PM  Staffing Anesthesiologist: Lenard SimmerKarenz, Tijuana Scheidegger, MD Performed: anesthesiologist   Preanesthetic Checklist Completed: patient identified, site marked, surgical consent, pre-op evaluation, timeout performed, IV checked, risks and benefits discussed and monitors and equipment checked  Epidural Patient position: sitting Prep: ChloraPrep Patient monitoring: heart rate, continuous pulse ox and blood pressure Approach: midline Location: L3-L4 Injection technique: LOR saline  Needle:  Needle type: Tuohy  Needle gauge: 17 G Needle length: 9 cm and 9 Needle insertion depth: 5.5 cm Catheter type: closed end flexible Catheter size: 19 Gauge Catheter at skin depth: 10 cm Test dose: negative and 1.5% lidocaine with Epi 1:200 K  Assessment Sensory level: T10 Events: blood not aspirated, injection not painful, no injection resistance, negative IV test and no paresthesia  Additional Notes 1st attempt Pt. Evaluated and documentation done after procedure finished. Patient identified. Risks/Benefits/Options discussed with patient including but not limited to bleeding, infection, nerve damage, paralysis, failed block, incomplete pain control, headache, blood pressure changes, nausea, vomiting, reactions to medication both or allergic, itching and postpartum back pain. Confirmed with bedside nurse the patient's most recent platelet count. Confirmed with patient that they are not currently taking any anticoagulation, have any bleeding history or any family history of bleeding disorders. Patient expressed understanding and wished to proceed. All questions were answered. Sterile technique was used throughout the entire procedure. Please see nursing notes for vital signs. Test dose was given through epidural catheter and negative prior to continuing to dose epidural or start infusion. Warning signs of high  block given to the patient including shortness of breath, tingling/numbness in hands, complete motor block, or any concerning symptoms with instructions to call for help. Patient was given instructions on fall risk and not to get out of bed. All questions and concerns addressed with instructions to call with any issues or inadequate analgesia.   Patient tolerated the insertion well without immediate complications.Reason for block:procedure for pain

## 2018-01-23 NOTE — Progress Notes (Signed)
Per Pattricia BossAnnie Thompson,CNM pt may be taken off monitors after a reactive 20 min strip.  Pt does not have to be continuous monitoring.  Marvell FullerPamela Gissella Niblack,RN

## 2018-01-23 NOTE — Progress Notes (Signed)
LABOR NOTE   Elizabeth Wolf 22 y.o.GP@ at 6312w1d PROM and Active phase labor.  SUBJECTIVE:   Patient is complaining of increased pressure during contractions. OBJECTIVE:  BP 113/75   Pulse 93   Temp 98.2 F (36.8 C) (Oral)   Resp 18   Ht 5\' 1"  (1.549 m)   Wt 182 lb (82.6 kg)   LMP 04/24/2017   SpO2 99%   BMI 34.39 kg/m  Total I/O In: -  Out: 400 [Urine:400]  She has shown cervical change. CERVIX: 8.5:  100%:   -1:   mid position:   soft SVE:   Dilation: 8.5 Effacement (%): 100 Station: -1 Exam by:: A. Thompsom, CNM CONTRACTIONS: regular, every 2-3.5 minutes 50-90 seconds FHR: Fetal heart tracing reviewed. Baseline: 145 bpm, Variability: Good {> 6 bpm), Accelerations: Reactive and Decelerations: Early Category I  Analgesia: Epidural  Labs: Lab Results  Component Value Date   WBC 9.9 01/23/2018   HGB 11.4 (L) 01/23/2018   HCT 33.1 (L) 01/23/2018   MCV 80.1 01/23/2018   PLT 255 01/23/2018    ASSESSMENT: 1) Labor curve reviewed.       Progress: PROM and Active phase labor.     Membranes: clear fluid     IUPC      Pitocin infusing    Active Problems:   Labor and delivery, indication for care   PLAN: Continue IV Pitocin augmentation  Anticipate NSVD  Shanika Creacy,SNM/Arlena Marsan,CNM 01/23/2018 8:40 PM

## 2018-01-23 NOTE — Anesthesia Preprocedure Evaluation (Signed)
Anesthesia Evaluation  Patient identified by MRN, date of birth, ID band Patient awake    Reviewed: Allergy & Precautions, H&P , NPO status , Patient's Chart, lab work & pertinent test results, reviewed documented beta blocker date and time   History of Anesthesia Complications Negative for: history of anesthetic complications  Airway Mallampati: II  TM Distance: >3 FB Neck ROM: full    Dental  (+) Dental Advidsory Given, Caps, Teeth Intact   Pulmonary neg pulmonary ROS,           Cardiovascular Exercise Tolerance: Good negative cardio ROS       Neuro/Psych PSYCHIATRIC DISORDERS negative neurological ROS  negative psych ROS   GI/Hepatic Neg liver ROS, GERD  ,  Endo/Other  negative endocrine ROS  Renal/GU negative Renal ROS  negative genitourinary   Musculoskeletal   Abdominal   Peds  Hematology negative hematology ROS (+)   Anesthesia Other Findings Past Medical History: No date: ADD (attention deficit disorder) No date: Breakthrough bleeding on OCPs No date: Nausea & vomiting   Reproductive/Obstetrics (+) Pregnancy                             Anesthesia Physical Anesthesia Plan  ASA: II  Anesthesia Plan: Epidural   Post-op Pain Management:    Induction:   PONV Risk Score and Plan:   Airway Management Planned:   Additional Equipment:   Intra-op Plan:   Post-operative Plan:   Informed Consent: I have reviewed the patients History and Physical, chart, labs and discussed the procedure including the risks, benefits and alternatives for the proposed anesthesia with the patient or authorized representative who has indicated his/her understanding and acceptance.   Dental Advisory Given  Plan Discussed with: Anesthesiologist, CRNA and Surgeon  Anesthesia Plan Comments:         Anesthesia Quick Evaluation

## 2018-01-23 NOTE — Progress Notes (Signed)
LABOR NOTE   Elizabeth Wolf 22 y.o.GP@ at 359w1d PROM  SUBJECTIVE:   Patient is feeling contractions increasing with frequency and intensity. OBJECTIVE:  BP 135/78   Pulse 68   Temp 97.7 F (36.5 C) (Axillary)   Resp 16   Ht 5\' 1"  (1.549 m)   Wt 182 lb (82.6 kg)   LMP 04/24/2017   BMI 34.39 kg/m  Total I/O In: 641.7 [I.V.:641.7] Out: -   She has shown cervical change. CERVIX: 3 cm:  80%:   -2:   mid position:   soft SVE:   Dilation: 3 Effacement (%): 80 Station: -2 Exam by:: A. Janee Mornhompson, CNM  Foley Bulb out with exam. CONTRACTIONS: regular, every 2-4 minutes 50-100 FHR: Fetal heart tracing reviewed. Baseline: 135 bpm, Variability: Good {> 6 bpm) and Accelerations: Reactive Category I   Analgesia: Nitrous Oxide  Labs: Lab Results  Component Value Date   WBC 9.9 01/23/2018   HGB 11.4 (L) 01/23/2018   HCT 33.1 (L) 01/23/2018   MCV 80.1 01/23/2018   PLT 255 01/23/2018    ASSESSMENT: 1) Labor curve reviewed.       Progress: PROM and Early latent labor.     Membranes: ruptured, clear fluid    Active Problems:   Labor and delivery, indication for care   PLAN: Third dose of cytotec placed. Discussed use of Pitocin and plan of care.   [facility] Creacy,SNM/ Doreene Burkennie Hallelujah Wysong, [place]  01/23/2018 12:57 PM

## 2018-01-24 LAB — CBC
HCT: 29.9 % — ABNORMAL LOW (ref 35.0–47.0)
Hemoglobin: 10.2 g/dL — ABNORMAL LOW (ref 12.0–16.0)
MCH: 27.2 pg (ref 26.0–34.0)
MCHC: 34.2 g/dL (ref 32.0–36.0)
MCV: 79.6 fL — ABNORMAL LOW (ref 80.0–100.0)
PLATELETS: 225 10*3/uL (ref 150–440)
RBC: 3.76 MIL/uL — ABNORMAL LOW (ref 3.80–5.20)
RDW: 14.6 % — AB (ref 11.5–14.5)
WBC: 22.9 10*3/uL — AB (ref 3.6–11.0)

## 2018-01-24 LAB — RPR: RPR Ser Ql: NONREACTIVE

## 2018-01-24 MED ORDER — TETANUS-DIPHTH-ACELL PERTUSSIS 5-2.5-18.5 LF-MCG/0.5 IM SUSP: 0.5000 mL | Freq: Once | INTRAMUSCULAR | Status: DC

## 2018-01-24 MED ORDER — ONDANSETRON HCL 4 MG/2ML IJ SOLN: 4.0000 mg | INTRAMUSCULAR | Status: DC | PRN

## 2018-01-24 MED ORDER — SENNOSIDES-DOCUSATE SODIUM 8.6-50 MG PO TABS
2.0000 | ORAL_TABLET | ORAL | Status: DC
  Filled 2018-01-24: qty 2

## 2018-01-24 MED ORDER — IBUPROFEN 600 MG PO TABS
600.0000 mg | ORAL_TABLET | Freq: Four times a day (QID) | ORAL | Status: DC
  Administered 2018-01-24 – 2018-01-25 (×7): 600 mg via ORAL
  Filled 2018-01-24 (×6): qty 1

## 2018-01-24 MED ORDER — DOCUSATE SODIUM 100 MG PO CAPS
100.0000 mg | ORAL_CAPSULE | Freq: Two times a day (BID) | ORAL | Status: DC
  Administered 2018-01-24 – 2018-01-25 (×3): 100 mg via ORAL
  Filled 2018-01-24 (×3): qty 1

## 2018-01-24 MED ORDER — DIBUCAINE 1 % RE OINT: 1.0000 "application " | TOPICAL_OINTMENT | RECTAL | Status: DC | PRN

## 2018-01-24 MED ORDER — SIMETHICONE 80 MG PO CHEW: 80.0000 mg | CHEWABLE_TABLET | ORAL | Status: DC | PRN

## 2018-01-24 MED ORDER — COCONUT OIL OIL
1.0000 "application " | TOPICAL_OIL | Status: DC | PRN
  Administered 2018-01-24: 1 via TOPICAL

## 2018-01-24 MED ORDER — ACETAMINOPHEN 325 MG PO TABS
ORAL_TABLET | ORAL | Status: AC
  Administered 2018-01-24: 650 mg via ORAL
  Filled 2018-01-24: qty 2

## 2018-01-24 MED ORDER — IBUPROFEN 600 MG PO TABS
ORAL_TABLET | ORAL | Status: AC
  Administered 2018-01-24: 600 mg via ORAL
  Filled 2018-01-24: qty 1

## 2018-01-24 MED ORDER — ACETAMINOPHEN 325 MG PO TABS
650.0000 mg | ORAL_TABLET | ORAL | Status: DC | PRN
  Administered 2018-01-24 (×2): 650 mg via ORAL
  Filled 2018-01-24: qty 2

## 2018-01-24 MED ORDER — BENZOCAINE-MENTHOL 20-0.5 % EX AERO
1.0000 "application " | INHALATION_SPRAY | CUTANEOUS | Status: DC | PRN
  Filled 2018-01-24 (×2): qty 56

## 2018-01-24 MED ORDER — FERROUS SULFATE 325 (65 FE) MG PO TABS
325.0000 mg | ORAL_TABLET | Freq: Every day | ORAL | Status: DC
  Administered 2018-01-24 – 2018-01-25 (×2): 325 mg via ORAL
  Filled 2018-01-24 (×2): qty 1

## 2018-01-24 MED ORDER — ONDANSETRON HCL 4 MG PO TABS: 4.0000 mg | ORAL_TABLET | ORAL | Status: DC | PRN

## 2018-01-24 MED ORDER — PRENATAL MULTIVITAMIN CH
1.0000 | ORAL_TABLET | Freq: Every day | ORAL | Status: DC
  Administered 2018-01-24 – 2018-01-25 (×2): 1 via ORAL
  Filled 2018-01-24 (×2): qty 1

## 2018-01-24 MED ORDER — OXYCODONE-ACETAMINOPHEN 5-325 MG PO TABS: 1.0000 | ORAL_TABLET | ORAL | Status: DC | PRN

## 2018-01-24 MED ORDER — WITCH HAZEL-GLYCERIN EX PADS: 1.0000 "application " | MEDICATED_PAD | CUTANEOUS | Status: DC | PRN

## 2018-01-24 MED ORDER — OXYCODONE-ACETAMINOPHEN 5-325 MG PO TABS: 2.0000 | ORAL_TABLET | ORAL | Status: DC | PRN

## 2018-01-24 NOTE — Lactation Note (Signed)
This note was copied from a baby's chart. Lactation Consultation Note  Patient Name: Elizabeth Darrin LuisMakayla Wickham WUJWJ'XToday's Date: 01/24/2018 Reason for consult: Follow-up assessment;Primapara;Nipple pain/trauma;Mother's request;Other (Comment);Difficult latch;Term(Flattish nipples)  Mom concerned because she is having difficulty latching Elizabeth Wolf to the breast for very long.  She will only latch to left breast for a few minutes and then comes off crying.  Mom does have flat nipples with an areola that is difficult to compress for deep latch which is why she is unable to sustain latch.  When she comes off the nipple it is pinched.  No positional stripe yet, but concerned that it may lead to one.  Assisted mom with comfortable position sitting up in chair with pillow support.    Mom insists that the only real comfortable position for her and Elizabeth Wolf is the cradle hold.  Demonstrated placement of #20 nipple shield.  Elizabeth Wolf latches more easily in cradle hold with nipple shield and continues to suck rhythmically at the left breast for much longer period.  Fewer swallows were heard.  Encouraged mom to massage breast while Elizabeth Wolf breast feeds.  Reviewed supply and demand, normal course of lactation and routine newborn feeding patterns. Elizabeth Wolf fell asleep at the breast after about 11 minutes.  As soon as she was removed from the breast, she was fussy and rooting and sucking on her hands.  Mom wanting to take a shower.  FOB holds Elizabeth Wolf, but she continues to cry, squirm and root.  Demonstrated how to let her suck on his finger while mom is in shower.  She seems satisfied to just be able to suck for now.  Lactation name and number are written on white board.  Encouraged parents to call with any questions, concerns or assistance.      Maternal Data Formula Feeding for Exclusion: No Has patient been taught Hand Expression?: Yes Does the patient have breastfeeding experience prior to this delivery?: No  Feeding Feeding Type: Breast  Fed Length of feed: 11 min  LATCH Score Latch: Repeated attempts needed to sustain latch, nipple held in mouth throughout feeding, stimulation needed to elicit sucking reflex.  Audible Swallowing: A few with stimulation  Type of Nipple: Flat  Comfort (Breast/Nipple): Filling, red/small blisters or bruises, mild/mod discomfort  Hold (Positioning): Assistance needed to correctly position infant at breast and maintain latch.  LATCH Score: 5  Interventions Interventions: Reverse pressure;Expressed milk;Assisted with latch;Breast compression;Adjust position;Breast massage;Support pillows;Comfort gels  Lactation Tools Discussed/Used Tools: Nipple Shields Nipple shield size: 20 WIC Program: Yes   Consult Status Consult Status: Follow-up Follow-up type: Call as needed    Elizabeth Wolf, Elizabeth Wolf 01/24/2018, 8:21 PM

## 2018-01-24 NOTE — Progress Notes (Signed)
Progress Note - Vaginal Delivery  Elizabeth Wolf is a 22 y.o. G1P1001 now PP day 1 s/p Vaginal, Spontaneous .   Subjective:  The patient reports no complaints, up ad lib, voiding, tolerating PO and + flatus  Objective:  Vital signs in last 24 hours: Temp:  [97.5 F (36.4 C)-99.2 F (37.3 C)] 98 F (36.7 C) (07/07 0840) Pulse Rate:  [72-131] 73 (07/07 0840) Resp:  [16-18] 18 (07/07 0840) BP: (86-126)/(55-84) 111/79 (07/07 0840) SpO2:  [98 %-100 %] 100 % (07/07 0840)  Physical Exam:  General: alert, cooperative, appears stated age and fatigued Lochia: appropriate Uterine Fundus: firm @ u-1 DVT Evaluation: No evidence of DVT seen on physical exam. No cords or calf tenderness. No significant calf/ankle edema.    Data Review Recent Labs    01/23/18 0446 01/24/18 0641  HGB 11.4* 10.2*  HCT 33.1* 29.9*    Assessment/Plan: Active Problems:   Labor and delivery, indication for care   Plan for discharge tomorrow and Breastfeeding  -- Continue routine PP care.     Doreene Burkennie Kailie Polus, CNM  01/24/2018 1:16 PM

## 2018-01-24 NOTE — Plan of Care (Signed)
Vs stable; up ad lib now; taking tylenol and motrin for pain control; regular diet; breastfeeding and needs assistance

## 2018-01-24 NOTE — Plan of Care (Signed)
Pt. Transferred to room 337 via wheel chair. Alert and oriented with cheerful affect. Pt. And sign. Other oriented to room, safety and Falls Policy and both V/O.

## 2018-01-24 NOTE — Anesthesia Postprocedure Evaluation (Signed)
Anesthesia Post Note  Patient: Elizabeth Wolf  Procedure(s) Performed: AN AD HOC LABOR EPIDURAL  Patient location during evaluation: Mother Baby Anesthesia Type: Epidural Level of consciousness: awake and alert Pain management: pain level controlled Vital Signs Assessment: post-procedure vital signs reviewed and stable Respiratory status: spontaneous breathing, nonlabored ventilation and respiratory function stable Cardiovascular status: stable Postop Assessment: no headache, no backache, patient able to bend at knees and able to ambulate Anesthetic complications: no     Last Vitals:  Vitals:   01/24/18 0241 01/24/18 0327  BP: 110/70 100/64  Pulse: 91 76  Resp: 16   Temp: 36.9 C 36.7 C  SpO2: 98% 100%    Last Pain:  Vitals:   01/24/18 0805  TempSrc:   PainSc: 0-No pain                 Cleda MccreedyJoseph K Piscitello

## 2018-01-25 ENCOUNTER — Encounter: Payer: BLUE CROSS/BLUE SHIELD | Admitting: Certified Nurse Midwife

## 2018-01-25 MED ORDER — FERROUS SULFATE 325 (65 FE) MG PO TABS: 325.0000 mg | ORAL_TABLET | Freq: Every day | ORAL | 3 refills | Status: DC

## 2018-01-25 MED ORDER — IBUPROFEN 600 MG PO TABS: 600.0000 mg | ORAL_TABLET | Freq: Four times a day (QID) | ORAL | 0 refills | Status: DC

## 2018-01-25 MED ORDER — ACETAMINOPHEN 325 MG PO TABS: 650.0000 mg | ORAL_TABLET | ORAL | 1 refills | Status: DC | PRN

## 2018-01-25 MED ORDER — DOCUSATE SODIUM 100 MG PO CAPS: 100.0000 mg | ORAL_CAPSULE | Freq: Two times a day (BID) | ORAL | 0 refills | Status: DC

## 2018-01-25 NOTE — Final Progress Note (Signed)
Discharge Day SOAP Note:  Progress Note - Vaginal Delivery  Elizabeth Wolf is a 22 y.o. G1P1001 now PP day 2 s/p Vaginal, Spontaneous . Delivery was uncomplicated  Subjective  The patient has the following complaints: has no unusual complaints  Pain is controlled with current medications.   Patient is urinating without difficulty.  She is ambulating well.    Objective  Vital signs: BP 109/67 (BP Location: Right Arm)   Pulse 77   Temp 98 F (36.7 C) (Oral)   Resp 18   Ht 5\' 1"  (1.549 m)   Wt 182 lb (82.6 kg)   LMP 04/24/2017   SpO2 100% Comment: room air  Breastfeeding? Unknown   BMI 34.39 kg/m   Physical Exam: Gen: NAD Fundus Fundal Tone: Firm  Lochia Amount: Small  Perineum Appearance: Edematous, Approximated     Data Review Labs: CBC Latest Ref Rng & Units 01/24/2018 01/23/2018 11/10/2017  WBC 3.6 - 11.0 K/uL 22.9(H) 9.9 9.0  Hemoglobin 12.0 - 16.0 g/dL 10.2(L) 11.4(L) 11.0(L)  Hematocrit 35.0 - 47.0 % 29.9(L) 33.1(L) 32.5(L)  Platelets 150 - 440 K/uL 225 255 215   B POS  Assessment/Plan  Active Problems:   Labor and delivery, indication for care    Plan for discharge today.   Discharge Instructions: Per After Visit Summary. Activity: Advance as tolerated. Pelvic rest for 6 weeks.  Also refer to After Visit Summary Diet: Regular Medications: Allergies as of 01/25/2018   No Known Allergies     Medication List    STOP taking these medications   Doxylamine-Pyridoxine 10-10 MG Tbec   EVENING PRIMROSE OIL PO     TAKE these medications   acetaminophen 325 MG tablet Commonly known as:  TYLENOL Take 2 tablets (650 mg total) by mouth every 4 (four) hours as needed (for pain scale < 4).   docusate sodium 100 MG capsule Commonly known as:  COLACE Take 1 capsule (100 mg total) by mouth 2 (two) times daily.   ferrous sulfate 325 (65 FE) MG tablet Take 1 tablet (325 mg total) by mouth daily with breakfast.   ibuprofen 600 MG tablet Commonly known  as:  ADVIL,MOTRIN Take 1 tablet (600 mg total) by mouth every 6 (six) hours.   prenatal multivitamin Tabs tablet Take 1 tablet daily at 12 noon by mouth.   ranitidine 150 MG tablet Commonly known as:  ZANTAC Take 1 tablet (150 mg total) by mouth 2 (two) times daily.      Outpatient follow up:  Postpartum contraception: She is unsure at this time. Will discuss at first office visit post-partum  Discharged Condition: good  Discharged to: home  Newborn Data: Disposition:home with mother  Apgars: APGAR (1 MIN): 6   APGAR (5 MINS): 9   APGAR (10 MINS):    Baby Feeding: Breast    Elizabeth Wolf,CNM  01/25/2018 7:34 AM

## 2018-01-25 NOTE — Plan of Care (Signed)
Vital signs stable; tolerating regular diet well; good po fluid intake; fundus firm; small amount rubra lochia; patient reported that her right breast nipple is "bleeding a little"; comfort gels and coconut oil in use; breastfeeding using nipple shield with good technique observed; husband at bedside and attentive; good maternal-infant bonding observed.

## 2018-01-25 NOTE — Progress Notes (Signed)
Pt discharged with infant.  Discharge instructions, prescriptions and follow up appointment given to and reviewed with pt. Pt verbalized understanding. Escorted out by auxillary. 

## 2018-01-25 NOTE — Discharge Summary (Signed)
Discharge Summary  Date of Admission: 01/23/2018  Date of Discharge: 01/25/2018  Admitting Diagnosis: Premature rupture of membrane at 7174w1d  Mode of Delivery: normal spontaneous vaginal delivery                 Discharge Diagnosis: No other diagnosis   Intrapartum Procedures: epidural   Post partum procedures: none  Complications: 1 degree perineal laceration                      Discharge Day SOAP Note:  Progress Note - Vaginal Delivery  Elizabeth Wolf is a 22 y.o. G1P1001 now PP day 2 s/p Vaginal, Spontaneous . Delivery was uncomplicated  Subjective  The patient has the following complaints: has no unusual complaints  Pain is controlled with current medications.   Patient is urinating without difficulty.  She is ambulating well.    Objective  Vital signs: BP 109/67 (BP Location: Right Arm)   Pulse 77   Temp 98 F (36.7 C) (Oral)   Resp 18   Ht 5\' 1"  (1.549 m)   Wt 182 lb (82.6 kg)   LMP 04/24/2017   SpO2 100% Comment: room air  Breastfeeding? Unknown   BMI 34.39 kg/m   Physical Exam: Gen: NAD Fundus Fundal Tone: Firm  Lochia Amount: Small  Perineum Appearance: Edematous, Approximated     Data Review Labs: CBC Latest Ref Rng & Units 01/24/2018 01/23/2018 11/10/2017  WBC 3.6 - 11.0 K/uL 22.9(H) 9.9 9.0  Hemoglobin 12.0 - 16.0 g/dL 10.2(L) 11.4(L) 11.0(L)  Hematocrit 35.0 - 47.0 % 29.9(L) 33.1(L) 32.5(L)  Platelets 150 - 440 K/uL 225 255 215   B POS  Assessment/Plan  Active Problems:   Labor and delivery, indication for care    Plan for discharge today.   Discharge Instructions: Per After Visit Summary. Activity: Advance as tolerated. Pelvic rest for 6 weeks.  Also refer to After Visit Summary Diet: Regular Medications: Allergies as of 01/25/2018   No Known Allergies     Medication List    STOP taking these medications   Doxylamine-Pyridoxine 10-10 MG Tbec   EVENING PRIMROSE OIL PO     TAKE these medications    acetaminophen 325 MG tablet Commonly known as:  TYLENOL Take 2 tablets (650 mg total) by mouth every 4 (four) hours as needed (for pain scale < 4).   docusate sodium 100 MG capsule Commonly known as:  COLACE Take 1 capsule (100 mg total) by mouth 2 (two) times daily.   ferrous sulfate 325 (65 FE) MG tablet Take 1 tablet (325 mg total) by mouth daily with breakfast.   ibuprofen 600 MG tablet Commonly known as:  ADVIL,MOTRIN Take 1 tablet (600 mg total) by mouth every 6 (six) hours.   prenatal multivitamin Tabs tablet Take 1 tablet daily at 12 noon by mouth.   ranitidine 150 MG tablet Commonly known as:  ZANTAC Take 1 tablet (150 mg total) by mouth 2 (two) times daily.      Outpatient follow up:  Postpartum contraception: She is unsure at this time. Will discuss at first office visit post-partum  Discharged Condition: good  Discharged to: home  Newborn Data: Disposition:home with mother  Apgars: APGAR (1 MIN): 6   APGAR (5 MINS): 9   APGAR (10 MINS):    Baby Feeding: Breast  Pattricia Boss Delaney Perona,CNM  01/25/2018 7:34 AM

## 2018-01-29 ENCOUNTER — Encounter: Payer: Self-pay | Admitting: Certified Nurse Midwife

## 2018-01-29 ENCOUNTER — Inpatient Hospital Stay: Admit: 2018-01-29 | Payer: Self-pay

## 2018-01-30 ENCOUNTER — Encounter: Payer: Self-pay | Admitting: Certified Nurse Midwife

## 2018-02-15 ENCOUNTER — Encounter: Payer: Self-pay | Admitting: Certified Nurse Midwife

## 2018-03-05 ENCOUNTER — Ambulatory Visit (INDEPENDENT_AMBULATORY_CARE_PROVIDER_SITE_OTHER): Payer: BLUE CROSS/BLUE SHIELD | Admitting: Certified Nurse Midwife

## 2018-03-05 ENCOUNTER — Encounter: Payer: Self-pay | Admitting: Certified Nurse Midwife

## 2018-03-05 MED ORDER — NORETHINDRONE 0.35 MG PO TABS: 1.0000 | ORAL_TABLET | Freq: Every day | ORAL | 11 refills | Status: DC

## 2018-03-05 NOTE — Patient Instructions (Signed)
Kegel Exercises Kegel exercises help strengthen the muscles that support the rectum, vagina, small intestine, bladder, and uterus. Doing Kegel exercises can help:  Improve bladder and bowel control.  Improve sexual response.  Reduce problems and discomfort during pregnancy.  Kegel exercises involve squeezing your pelvic floor muscles, which are the same muscles you squeeze when you try to stop the flow of urine. The exercises can be done while sitting, standing, or lying down, but it is best to vary your position. Phase 1 exercises 1. Squeeze your pelvic floor muscles tight. You should feel a tight lift in your rectal area. If you are a female, you should also feel a tightness in your vaginal area. Keep your stomach, buttocks, and legs relaxed. 2. Hold the muscles tight for up to 10 seconds. 3. Relax your muscles. Repeat this exercise 50 times a day or as many times as told by your health care provider. Continue to do this exercise for at least 4-6 weeks or for as long as told by your health care provider. This information is not intended to replace advice given to you by your health care provider. Make sure you discuss any questions you have with your health care provider. Document Released: 06/23/2012 Document Revised: 03/01/2016 Document Reviewed: 05/27/2015 Elsevier Interactive Patient Education  2018 [place] 18-39 Years, Female Preventive care refers to lifestyle choices and visits with your health care provider that can promote health and wellness. What does preventive care include?  A yearly physical exam. This is also called an annual well check.  Dental exams once or twice a year.  Routine eye exams. Ask your health care provider how often you should have your eyes checked.  Personal lifestyle choices, including: ? Daily care of your teeth and gums. ? Regular physical activity. ? Eating a healthy diet. ? Avoiding tobacco and drug use. ? Limiting  alcohol use. ? Practicing safe sex. ? Taking vitamin and mineral supplements as recommended by your health care provider. What happens during an annual well check? The services and screenings done by your health care provider during your annual well check will depend on your age, overall health, lifestyle risk factors, and family history of disease. Counseling Your health care provider may ask you questions about your:  Alcohol use.  Tobacco use.  Drug use.  Emotional well-being.  Home and relationship well-being.  Sexual activity.  Eating habits.  Work and work Statistician.  Method of birth control.  Menstrual cycle.  Pregnancy history.  Screening You may have the following tests or measurements:  Height, weight, and BMI.  Diabetes screening. This is done by checking your blood sugar (glucose) after you have not eaten for a while (fasting).  Blood pressure.  Lipid and cholesterol levels. These may be checked every 5 years starting at age 29.  Skin check.  Hepatitis C blood test.  Hepatitis B blood test.  Sexually transmitted disease (STD) testing.  BRCA-related cancer screening. This may be done if you have a family history of breast, ovarian, tubal, or peritoneal cancers.  Pelvic exam and Pap test. This may be done every 3 years starting at age 50. Starting at age 80, this may be done every 5 years if you have a Pap test in combination with an HPV test.  Discuss your test results, treatment options, and if necessary, the need for more tests with your health care provider. Vaccines Your health care provider may recommend certain vaccines, such as:  Influenza vaccine. This is  recommended every year.  Tetanus, diphtheria, and acellular pertussis (Tdap, Td) vaccine. You may need a Td booster every 10 years.  Varicella vaccine. You may need this if you have not been vaccinated.  HPV vaccine. If you are 3 or younger, you may need three doses over 6  months.  Measles, mumps, and rubella (MMR) vaccine. You may need at least one dose of MMR. You may also need a second dose.  Pneumococcal 13-valent conjugate (PCV13) vaccine. You may need this if you have certain conditions and were not previously vaccinated.  Pneumococcal polysaccharide (PPSV23) vaccine. You may need one or two doses if you smoke cigarettes or if you have certain conditions.  Meningococcal vaccine. One dose is recommended if you are age 93-21 years and a first-year college student living in a residence hall, or if you have one of several medical conditions. You may also need additional booster doses.  Hepatitis A vaccine. You may need this if you have certain conditions or if you travel or work in places where you may be exposed to hepatitis A.  Hepatitis B vaccine. You may need this if you have certain conditions or if you travel or work in places where you may be exposed to hepatitis B.  Haemophilus influenzae type b (Hib) vaccine. You may need this if you have certain risk factors.  Talk to your health care provider about which screenings and vaccines you need and how often you need them. This information is not intended to replace advice given to you by your health care provider. Make sure you discuss any questions you have with your health care provider. Document Released: 09/02/2001 Document Revised: 03/26/2016 Document Reviewed: 05/08/2015 Elsevier Interactive Patient Education  [employer].

## 2018-03-05 NOTE — Progress Notes (Signed)
Subjective:    Elizabeth Wolf is a 22 y.o. 601P1001 Caucasian female who presents for a postpartum visit. She is 6  weeks postpartum following a spontaneous vaginal delivery at 39.1 gestational weeks. Anesthesia: epidural. I have fully reviewed the prenatal and intrapartum course. Postpartum course has been wnl. Baby's course has been WNL. Baby is feeding by breast. Bleeding no bleeding. Bowel function is normal. Bladder function is normal. Patient is not sexually active. Last sexual activity: prior to delivery. Contraception method is none. Disused options. Request to start pill.  Postpartum depression screening: negative. Score 1.  Pap is due.   The following portions of the patient's history were reviewed and updated as appropriate: allergies, current medications, past medical history, past surgical history and problem list.  Review of Systems Pertinent items are noted in HPI.   Vitals:   03/05/18 0956  BP: 103/65  Pulse: 79  Weight: 170 lb 8 oz (77.3 kg)  Height: 5\' 1"  (1.549 m)   No LMP recorded.  Objective:   General:  alert, cooperative and no distress   Breasts:  deferred, no complaints  Lungs: clear to auscultation bilaterally  Heart:  regular rate and rhythm  Abdomen: soft, nontender   Vulva: normal  Vagina: normal vagina  Cervix:  closed  Corpus: Well-involuted  Adnexa:  Non-palpable  Rectal Exam: no hemorrhoids        Assessment:   Postpartum exam 6 wks s/p NSVD Breastfeeding Depression screening Contraception counseling   Plan:  : oral progesterone-only contraceptive Follow up in: 6 months for annual with pap smear or earlier if needed  [facility]ie Soloman Mckeithan,CNM

## 2018-04-28 ENCOUNTER — Encounter: Payer: Self-pay | Admitting: Certified Nurse Midwife

## 2018-04-28 ENCOUNTER — Ambulatory Visit (INDEPENDENT_AMBULATORY_CARE_PROVIDER_SITE_OTHER): Payer: BLUE CROSS/BLUE SHIELD | Admitting: Certified Nurse Midwife

## 2018-04-28 VITALS — BP 107/77 | HR 93 | Ht 61.0 in | Wt 181.2 lb

## 2018-04-28 DIAGNOSIS — Z3009 Encounter for other general counseling and advice on contraception: Secondary | ICD-10-CM

## 2018-04-28 LAB — POCT URINE PREGNANCY: Preg Test, Ur: NEGATIVE

## 2018-04-28 MED ORDER — NORELGESTROMIN-ETH ESTRADIOL 150-35 MCG/24HR TD PTWK: 1.0000 | MEDICATED_PATCH | TRANSDERMAL | 12 refills | Status: DC

## 2018-04-28 NOTE — Progress Notes (Addendum)
Subjective:    Elizabeth Wolf is a 22 y.o. female who presents for contraception counseling. The patient has no complaints today. The patient is sexually active.  She has stopped breastfeeding and would like to start a different BC method. She states she can not remember to take pill every day. Pertinent past medical history: none.  Menstrual History: OB History    Gravida  1   Para  1   Term  1   Preterm  0   AB  0   Living  1     SAB  0   TAB  0   Ectopic  0   Multiple  0   Live Births  1           No LMP recorded.   The following portions of the patient's history were reviewed and updated as appropriate: allergies, current medications, past family history, past medical history, past social history, past surgical history and problem list.  Review of Systems Pertinent items are noted in HPI.   Objective:    No exam performed today, not indicated. Had pelvic with postpartum visit. .   Assessment:    22 y.o., starting Ortho-Evra patches weekly, no contraindications.   Plan:   Pt denies any contraindication for use of patch. Follow up as scheduled for annual exam or PRN.  I attest more than 50% of visit spend reviewing Birth control options including: patch, pill, ring, IUD, nexplanon, condoms. Discussed patch and use. Face to face time 10 minutes.   All questions answered.    Doreene Burke, CNM

## 2018-04-28 NOTE — Patient Instructions (Addendum)

## 2018-10-12 ENCOUNTER — Encounter: Payer: BLUE CROSS/BLUE SHIELD | Admitting: Certified Nurse Midwife

## 2019-01-20 ENCOUNTER — Ambulatory Visit: Payer: Self-pay | Admitting: Internal Medicine

## 2019-02-22 ENCOUNTER — Ambulatory Visit: Payer: Self-pay | Admitting: Internal Medicine

## 2020-01-25 ENCOUNTER — Encounter: Payer: Self-pay | Admitting: Adult Health

## 2020-01-25 ENCOUNTER — Other Ambulatory Visit: Payer: Self-pay

## 2020-01-25 ENCOUNTER — Ambulatory Visit (INDEPENDENT_AMBULATORY_CARE_PROVIDER_SITE_OTHER): Payer: 59 | Admitting: Adult Health

## 2020-01-25 VITALS — BP 108/73 | HR 82 | Temp 97.3°F | Ht 62.0 in | Wt 193.0 lb

## 2020-01-25 DIAGNOSIS — Z6835 Body mass index (BMI) 35.0-35.9, adult: Secondary | ICD-10-CM | POA: Diagnosis not present

## 2020-01-25 DIAGNOSIS — G4700 Insomnia, unspecified: Secondary | ICD-10-CM

## 2020-01-25 DIAGNOSIS — Z3009 Encounter for other general counseling and advice on contraception: Secondary | ICD-10-CM | POA: Diagnosis not present

## 2020-01-25 DIAGNOSIS — Z1389 Encounter for screening for other disorder: Secondary | ICD-10-CM | POA: Diagnosis not present

## 2020-01-25 DIAGNOSIS — L819 Disorder of pigmentation, unspecified: Secondary | ICD-10-CM | POA: Diagnosis not present

## 2020-01-25 DIAGNOSIS — L812 Freckles: Secondary | ICD-10-CM

## 2020-01-25 DIAGNOSIS — Z13220 Encounter for screening for lipoid disorders: Secondary | ICD-10-CM

## 2020-01-25 DIAGNOSIS — F418 Other specified anxiety disorders: Secondary | ICD-10-CM

## 2020-01-25 DIAGNOSIS — Z8659 Personal history of other mental and behavioral disorders: Secondary | ICD-10-CM

## 2020-01-25 DIAGNOSIS — L918 Other hypertrophic disorders of the skin: Secondary | ICD-10-CM | POA: Diagnosis not present

## 2020-01-25 LAB — POCT URINALYSIS DIPSTICK
Bilirubin, UA: NEGATIVE
Blood, UA: NEGATIVE
Glucose, UA: NEGATIVE
Ketones, UA: NEGATIVE
Leukocytes, UA: NEGATIVE
Nitrite, UA: NEGATIVE
Protein, UA: NEGATIVE
Spec Grav, UA: 1.015 (ref 1.010–1.025)
Urobilinogen, UA: 0.2 E.U./dL
pH, UA: 6 (ref 5.0–8.0)

## 2020-01-25 MED ORDER — ESCITALOPRAM OXALATE 10 MG PO TABS: 10.0000 mg | ORAL_TABLET | Freq: Every day | ORAL | 0 refills | Status: DC

## 2020-01-25 NOTE — Progress Notes (Signed)
New patient visit   Patient: Elizabeth Wolf   DOB: May 30, 1996   24 y.o. Female  MRN: [id number] Visit Date: 01/25/2020  Today's healthcare provider: Jairo Ben, FNP   Chief Complaint  Patient presents with  . Establish Care   Velora Mediate as a scribe for [employer] Susan Bleich, FNP.,have documented all relevant documentation on the behalf of Jairo Ben, FNP,as directed by  Jairo Ben, FNP while in the presence of Jairo Ben, FNP.  Subjective    Elizabeth Wolf is a 24 y.o. female who presents today as a new patient to establish care.  HPI    Patient is a 24 year old female in no acute distress who comes to the clinic with concerns of depression.She has a positive depression and anxiety scoring.   She wants to discuss birth control as well. Last PAP -   She sees did see  Obgyn 2019. G1P1 post partum visit was 03/05/2018. Not breastfeeding.    No documented PAP results- she does not remember having a PAP. Reviewed chart initial pregnancy visit with OBGYN wanted to wait for post partum PAP and postpartum was to follow uo in 6 months. Not performed PAP.  She has done the Depo Shot in the past for 6 months, she stopped it because she had to get it at the pharmacy. Birth control current - withdrawal method.   She denies any family history of breast cancer and no history herself of cancer or any lumps.  Denies smoking.  No history of DVT.   She has been on oral contraceptives in the past but does not want to take again as she reports she is unable to take them. She may want to conceive in the future.    She reports she has been on Adderal in the past for ADD. She was on this 3 years ago and she does not like medications. She has never been on aniety or depression medications.   She is not able to fall asleep, always feels tired. Has nightmares and is restless. Denies any medication. Denies any suicidal thoughts or  homicidal thoughts or intents.    [facility] dermatology for freckled skin and multiple atypical nevi, as well as irritated skin tags under the arms from friction.   Patient  denies any fever, body aches,chills, rash, chest pain, shortness of breath, nausea, vomiting, or diarrhea.   Denies dizziness, lightheadedness, pre syncopal or syncopal episodes.     Past Medical History:  Diagnosis Date  . ADD (attention deficit disorder)   . Breakthrough bleeding on OCPs   . Nausea & vomiting    Past Surgical History:  Procedure Laterality Date  . WISDOM TOOTH EXTRACTION     Family Status  Relation Name Status  . Mother  Alive  . PGF  Deceased  . Father  Alive  . MGM  Alive  . MGF  Deceased  . PGM  Deceased   Family History  Problem Relation Age of Onset  . Diabetes Mother   . Cancer Paternal Grandfather   . Heart disease Paternal Grandfather   . Obesity Father   . Cancer Maternal Grandmother    Social History   Socioeconomic History  . Marital status: Married    Spouse name: [name]  . Number of children: 1  . Years of education: Not on file  . Highest education level: Not on file  Occupational History  . Not on file  Tobacco Use  .  Smoking status: Never Smoker  . Smokeless tobacco: Never Used  Vaping Use  . Vaping Use: Never used  Substance and Sexual Activity  . Alcohol use: No  . Drug use: No  . Sexual activity: Yes    Birth control/protection: None  Other Topics Concern  . Not on file  Social History Narrative  . Not on file   Social Determinants of Health   Financial Resource Strain:   . Difficulty of Paying Living Expenses:   Food Insecurity:   . Worried About Programme researcher, broadcasting/film/videounning Out of Food in the Last Year:   . Baristaan Out of Food in the Last Year:   Transportation Needs:   . Freight forwarderLack of Transportation (Medical):   Marland Kitchen. Lack of Transportation (Non-Medical):   Physical Activity:   . Days of Exercise per Week:   . Minutes of Exercise per Session:   Stress:   . Feeling of  Stress :   Social Connections:   . Frequency of Communication with Friends and Family:   . Frequency of Social Gatherings with Friends and Family:   . Attends Religious Services:   . Active Member of Clubs or Organizations:   . Attends BankerClub or Organization Meetings:   Marland Kitchen. Marital Status:    Outpatient Medications Prior to Visit  Medication Sig  . [DISCONTINUED] norelgestromin-ethinyl estradiol (ORTHO EVRA) 150-35 MCG/24HR transdermal patch Place 1 patch onto the skin once a week.   No facility-administered medications prior to visit.   No Known Allergies  Immunization History  Administered Date(s) Administered  . Influenza,inj,Quad PF,6+ Mos 08/21/2017  . Tdap 11/10/2017    Health Maintenance  Topic Date Due  . Hepatitis C Screening  Never done  . COVID-19 Vaccine (1) Never done  . PAP-Cervical Cytology Screening  Never done  . PAP SMEAR-Modifier  Never done  . INFLUENZA VACCINE  02/19/2020  . TETANUS/TDAP  11/11/2027  . HIV Screening  Completed    Patient Care Team: Vestal Crandall, Eula FriedMichelle S, FNP as PCP - General (Family Medicine)  Review of Systems  Constitutional: Positive for fatigue. Negative for activity change, appetite change, chills, diaphoresis, fever and unexpected weight change.  HENT: Negative.   Eyes: Negative.   Respiratory: Negative.   Cardiovascular: Negative.   Gastrointestinal: Negative.   Endocrine: Negative.   Genitourinary: Negative.   Musculoskeletal: Negative.   Skin: Positive for color change (skin tags irritated under arms, freckles throughout body and moles. ).  Allergic/Immunologic: Negative.   Neurological: Negative.   Hematological: Negative.   Psychiatric/Behavioral: Positive for decreased concentration and dysphoric mood. Negative for agitation, behavioral problems, confusion and hallucinations. The patient is nervous/anxious. The patient is not hyperactive.     Last CBC Lab Results  Component Value Date   WBC 22.9 (H) 01/24/2018    HGB 10.2 (L) 01/24/2018   HCT 29.9 (L) 01/24/2018   MCV 79.6 (L) 01/24/2018   MCH 27.2 01/24/2018   RDW 14.6 (H) 01/24/2018   PLT 225 01/24/2018   Last metabolic panel No results found for: GLUCOSE, NA, K, CL, CO2, BUN, CREATININE, GFRNONAA, GFRAA, CALCIUM, PHOS, PROT, ALBUMIN, LABGLOB, AGRATIO, BILITOT, ALKPHOS, AST, ALT, ANIONGAP Last lipids No results found for: CHOL, HDL, LDLCALC, LDLDIRECT, TRIG, CHOLHDL Last hemoglobin A1c Lab Results  Component Value Date   HGBA1C 4.9 06/30/2017   Last thyroid functions Lab Results  Component Value Date   TSH 2.810 06/30/2017   Last vitamin D No results found for: 25OHVITD2, 25OHVITD3, VD25OH Last vitamin B12 and Folate No results found for: VITAMINB12,  FOLATE    Objective     Physical Exam BP 108/73 (BP Location: Right Arm, Patient Position: Sitting, Cuff Size: Normal)   Pulse 82   Temp (!) 97.3 F (36.3 C) (Temporal)   Ht 5\' 2"  (1.575 m)   Wt 193 lb (87.5 kg)   BMI 35.30 kg/m   General Appearance:    Alert, cooperative, no distress, appears stated age  Head:    Normocephalic, without obvious abnormality, atraumatic  Eyes:    PERRL, conjunctiva/corneas clear, EOM's intact, fundi    benign, both eyes  Ears:    Normal TM's and external ear canals, both ears  Nose:   Nares normal, septum midline, mucosa normal, no drainage    or sinus tenderness  Throat:   Lips, mucosa, and tongue normal; teeth and gums normal  Neck:   Supple, symmetrical, trachea midline, no adenopathy;    thyroid:  no enlargement/tenderness/nodules; no carotid   bruit or JVD  Back:     Symmetric, no curvature, ROM normal, no CVA tenderness  Lungs:     Clear to auscultation bilaterally, respirations unlabored  Chest Wall:    No tenderness or deformity   Heart:    Regular rate and rhythm, S1 and S2 normal, no murmur, rub   or gallop  Breast Exam:    No tenderness, masses, or nipple abnormality  Abdomen:     Soft, non-tender, bowel sounds active all four  quadrants,    no masses, no organomegaly  Genitalia:    Normal female without lesion, discharge or tenderness  Rectal:    Normal tone, normal prostate, no masses or tenderness;   guaiac negative stool  Extremities:   Extremities normal, atraumatic, no cyanosis or edema  Pulses:   2+ and symmetric all extremities  Skin:   Skin color, texture, turgor normal, bilateral skin tags darker in color than other skin with mild erythema, freckles all over skin, mole on left lower chin uniform in color tan. Brown no irregularity, other skin covered with clothing and not examined.   Lymph nodes:   Cervical, supraclavicular, and axillary nodes normal  Neurologic:   CNII-XII intact, normal strength, sensation and reflexes    Throughout. Patient appers well, not sickly. Speaking in complete sentences. Patient moves on and off of exam table and in room without difficulty. Gait is normal in hall and in room. Patient is oriented to person place time and situation. Patient answers questions appropriately and engages eye contact and verbal dialect with provider.       Office Visit from 01/25/2020 in [facility]  PHQ-9 Total Score 19     Depression Screen PHQ 2/9 Scores 01/25/2020 03/05/2018  PHQ - 2 Score 4 0  PHQ- 9 Score 19 1    GAD score today is 11  Results for orders placed or performed in visit on 01/25/20  POCT Urinalysis Dipstick  Result Value Ref Range   Color, UA pale yellow    Clarity, UA clear    Glucose, UA Negative Negative   Bilirubin, UA negative    Ketones, UA negative    Spec Grav, UA 1.015 1.010 - 1.025   Blood, UA negative    pH, UA 6.0 5.0 - 8.0   Protein, UA Negative Negative   Urobilinogen, UA 0.2 0.2 or 1.0 E.U./dL   Nitrite, UA negative    Leukocytes, UA Negative Negative   Appearance     Odor      Assessment & Plan  The primary encounter diagnosis was Body mass index (BMI) of 35.0-35.9 in adult. Diagnoses of Depression with anxiety, Insomnia, unspecified  type, Screening cholesterol level, Screening for blood or protein in urine, Atypical pigmented skin lesion, Freckled skin, Inflamed skin tag, Birth control counseling, and History of attention deficit hyperactivity disorder (ADHD) were also pertinent to this visit. CPE/ PAP at next visit with follow up on Anxiety/ depression  Discussed known black box warning for anti depression/ anxiety medication. Need to report any behavioral changes right, if any homicidal or suicidal thoughts or ideas seek medical attention right away. Call 911.  Marland Kitchen Counseling always recommended.  Meds ordered this encounter  Medications  . escitalopram (LEXAPRO) 10 MG tablet    Sig: Take 1 tablet (10 mg total) by mouth daily.    Dispense:  90 tablet    Refill:  0   fasting lasbs  She would like to see dermatology for full body skin check.   PAP over due. She is aware.  Orders Placed This Encounter  Procedures  . TSH  . CBC with Differential/Platelet  . Comprehensive Metabolic Panel (CMET)  . Lipid Panel w/o Chol/HDL Ratio  . Ambulatory referral to Dermatology  . POCT Urinalysis Dipstick   The patient is advised to begin progressive daily aerobic exercise program, follow a low fat, low cholesterol diet, attempt to lose weight, reduce exposure to stress, improve dietary compliance, continue current medications, continue current healthy lifestyle patterns and return for routine annual checkups.  Discussed multiple birth control options, she says she can not remember the pill, would prefer to avoid Depo again with side effects, and discussed implant versus IUD and she will think on this and let me known I advised her we can schedule her with colleague who performs both. Recommend condoms with withdrawal method and or spermicides for added protection while she is deciding.    Red Flags discussed. The patient was given clear instructions to go to ER or return to medical center if any red flags develop, symptoms do not  improve, worsen or new problems develop. They verbalized understanding.  Return in about 1 month (around 02/25/2020), or if symptoms worsen or fail to improve, for at any time for any worsening symptoms, Go to Emergency room/ urgent care if worse.     IBeverely Pace Demarie Hyneman, FNP, have reviewed all documentation for this visit. The documentation on 01/25/20 for the exam, diagnosis, procedures, and orders are all accurate and complete.    Jairo Ben, FNP  [facility] [phone] (phone) [phone] (fax)  [facility]

## 2020-01-25 NOTE — Patient Instructions (Signed)
Escitalopram tablets What is this medicine? ESCITALOPRAM (es sye TAL oh pram) is used to treat depression and certain types of anxiety. This medicine may be used for other purposes; ask your health care provider or pharmacist if you have questions. COMMON BRAND NAME(S): Lexapro What should I tell my health care provider before I take this medicine? They need to know if you have any of these conditions:  bipolar disorder or a family history of bipolar disorder  diabetes  glaucoma  heart disease  kidney or liver disease  receiving electroconvulsive therapy  seizures (convulsions)  suicidal thoughts, plans, or attempt by you or a family member  an unusual or allergic reaction to escitalopram, the related drug citalopram, other medicines, foods, dyes, or preservatives  pregnant or trying to become pregnant  breast-feeding How should I use this medicine? Take this medicine by mouth with a glass of water. Follow the directions on the prescription label. You can take it with or without food. If it upsets your stomach, take it with food. Take your medicine at regular intervals. Do not take it more often than directed. Do not stop taking this medicine suddenly except upon the advice of your doctor. Stopping this medicine too quickly may cause serious side effects or your condition may worsen. A special MedGuide will be given to you by the pharmacist with each prescription and refill. Be sure to read this information carefully each time. Talk to your pediatrician regarding the use of this medicine in children. Special care may be needed. Overdosage: If you think you have taken too much of this medicine contact a poison control center or emergency room at once. NOTE: This medicine is only for you. Do not share this medicine with others. What if I miss a dose? If you miss a dose, take it as soon as you can. If it is almost time for your next dose, take only that dose. Do not take double or  extra doses. What may interact with this medicine? Do not take this medicine with any of the following medications:  certain medicines for fungal infections like fluconazole, itraconazole, ketoconazole, posaconazole, voriconazole  cisapride  citalopram  dronedarone  linezolid  MAOIs like Carbex, Eldepryl, Marplan, Nardil, and Parnate  methylene blue (injected into a vein)  pimozide  thioridazine This medicine may also interact with the following medications:  alcohol  amphetamines  aspirin and aspirin-like medicines  carbamazepine  certain medicines for depression, anxiety, or psychotic disturbances  certain medicines for migraine headache like almotriptan, eletriptan, frovatriptan, naratriptan, rizatriptan, sumatriptan, zolmitriptan  certain medicines for sleep  certain medicines that treat or prevent blood clots like warfarin, enoxaparin, dalteparin  cimetidine  diuretics  dofetilide  fentanyl  furazolidone  isoniazid  lithium  metoprolol  NSAIDs, medicines for pain and inflammation, like ibuprofen or naproxen  other medicines that prolong the QT interval (cause an abnormal heart rhythm)  procarbazine  rasagiline  supplements like St. John's wort, kava kava, valerian  tramadol  tryptophan  ziprasidone This list may not describe all possible interactions. Give your health care provider a list of all the medicines, herbs, non-prescription drugs, or dietary supplements you use. Also tell them if you smoke, drink alcohol, or use illegal drugs. Some items may interact with your medicine. What should I watch for while using this medicine? Tell your doctor if your symptoms do not get better or if they get worse. Visit your doctor or health care professional for regular checks on your progress. Because it may   take several weeks to see the full effects of this medicine, it is important to continue your treatment as prescribed by your doctor. Patients  and their families should watch out for new or worsening thoughts of suicide or depression. Also watch out for sudden changes in feelings such as feeling anxious, agitated, panicky, irritable, hostile, aggressive, impulsive, severely restless, overly excited and hyperactive, or not being able to sleep. If this happens, especially at the beginning of treatment or after a change in dose, call your health care professional. Bonita Quin may get drowsy or dizzy. Do not drive, use machinery, or do anything that needs mental alertness until you know how this medicine affects you. Do not stand or sit up quickly, especially if you are an older patient. This reduces the risk of dizzy or fainting spells. Alcohol may interfere with the effect of this medicine. Avoid alcoholic drinks. Your mouth may get dry. Chewing sugarless gum or sucking hard candy, and drinking plenty of water may help. Contact your doctor if the problem does not go away or is severe. What side effects may I notice from receiving this medicine? Side effects that you should report to your doctor or health care professional as soon as possible:  allergic reactions like skin rash, itching or hives, swelling of the face, lips, or tongue  anxious  black, tarry stools  changes in vision  confusion  elevated mood, decreased need for sleep, racing thoughts, impulsive behavior  eye pain  fast, irregular heartbeat  feeling faint or lightheaded, falls  feeling agitated, angry, or irritable  hallucination, loss of contact with reality  loss of balance or coordination  loss of memory  painful or prolonged erections  restlessness, pacing, inability to keep still  seizures  stiff muscles  suicidal thoughts or other mood changes  trouble sleeping  unusual bleeding or bruising  unusually weak or tired  vomiting Side effects that usually do not require medical attention (report to your doctor or health care professional if they  continue or are bothersome):  changes in appetite  change in sex drive or performance  headache  increased sweating  indigestion, nausea  tremors This list may not describe all possible side effects. Call your doctor for medical advice about side effects. You may report side effects to FDA at 1-800-FDA-1088. Where should I keep my medicine? Keep out of reach of children. Store at room temperature between 15 and 30 degrees C (59 and 86 degrees F). Throw away any unused medicine after the expiration date. NOTE: This sheet is a summary. It may not cover all possible information. If you have questions about this medicine, talk to your doctor, pharmacist, or health care provider.  2020 Elsevier/Gold Standard (2018-06-28 11:21:44) Health Maintenance, Female Adopting a healthy lifestyle and getting preventive care are important in promoting health and wellness. Ask your health care provider about:  The right schedule for you to have regular tests and exams.  Things you can do on your own to prevent diseases and keep yourself healthy. What should I know about diet, weight, and exercise? Eat a healthy diet   Eat a diet that includes plenty of vegetables, fruits, low-fat dairy products, and lean protein.  Do not eat a lot of foods that are high in solid fats, added sugars, or sodium. Maintain a healthy weight Body mass index (BMI) is used to identify weight problems. It estimates body fat based on height and weight. Your health care provider can help determine your BMI and help  you achieve or maintain a healthy weight. Get regular exercise Get regular exercise. This is one of the most important things you can do for your health. Most adults should:  Exercise for at least 150 minutes each week. The exercise should increase your heart rate and make you sweat (moderate-intensity exercise).  Do strengthening exercises at least twice a week. This is in addition to the moderate-intensity  exercise.  Spend less time sitting. Even light physical activity can be beneficial. Watch cholesterol and blood lipids Have your blood tested for lipids and cholesterol at 26 years of age, then have this test every 5 years. Have your cholesterol levels checked more often if:  Your lipid or cholesterol levels are high.  You are older than 26 years of age.  You are at high risk for heart disease. What should I know about cancer screening? Depending on your health history and family history, you may need to have cancer screening at various ages. This may include screening for:  Breast cancer.  Cervical cancer.  Colorectal cancer.  Skin cancer.  Lung cancer. What should I know about heart disease, diabetes, and high blood pressure? Blood pressure and heart disease  High blood pressure causes heart disease and increases the risk of stroke. This is more likely to develop in people who have high blood pressure readings, are of African descent, or are overweight.  Have your blood pressure checked: ? Every 3-5 years if you are 43-86 years of age. ? Every year if you are 84 years old or older. Diabetes Have regular diabetes screenings. This checks your fasting blood sugar level. Have the screening done:  Once every three years after age 34 if you are at a normal weight and have a low risk for diabetes.  More often and at a younger age if you are overweight or have a high risk for diabetes. What should I know about preventing infection? Hepatitis B If you have a higher risk for hepatitis B, you should be screened for this virus. Talk with your health care provider to find out if you are at risk for hepatitis B infection. Hepatitis C Testing is recommended for:  Everyone born from [birth year] through 1967.  Anyone with known risk factors for hepatitis C. Sexually transmitted infections (STIs)  Get screened for STIs, including gonorrhea and chlamydia, if: ? You are sexually active and  are younger than 26 years of age. ? You are older than 26 years of age and your health care provider tells you that you are at risk for this type of infection. ? Your sexual activity has changed since you were last screened, and you are at increased risk for chlamydia or gonorrhea. Ask your health care provider if you are at risk.  Ask your health care provider about whether you are at high risk for HIV. Your health care provider may recommend a prescription medicine to help prevent HIV infection. If you choose to take medicine to prevent HIV, you should first get tested for HIV. You should then be tested every 3 months for as long as you are taking the medicine. Pregnancy  If you are about to stop having your period (premenopausal) and you may become pregnant, seek counseling before you get pregnant.  Take 400 to 800 micrograms (mcg) of folic acid every day if you become pregnant.  Ask for birth control (contraception) if you want to prevent pregnancy. Osteoporosis and menopause Osteoporosis is a disease in which the bones lose minerals and strength  with aging. This can result in bone fractures. If you are 48 years old or older, or if you are at risk for osteoporosis and fractures, ask your health care provider if you should:  Be screened for bone loss.  Take a calcium or vitamin D supplement to lower your risk of fractures.  Be given hormone replacement therapy (HRT) to treat symptoms of menopause. Follow these instructions at home: Lifestyle  Do not use any products that contain nicotine or tobacco, such as cigarettes, e-cigarettes, and chewing tobacco. If you need help quitting, ask your health care provider.  Do not use street drugs.  Do not share needles.  Ask your health care provider for help if you need support or information about quitting drugs. Alcohol use  Do not drink alcohol if: ? Your health care provider tells you not to drink. ? You are pregnant, may be pregnant,  or are planning to become pregnant.  If you drink alcohol: ? Limit how much you use to 0-1 drink a day. ? Limit intake if you are breastfeeding.  Be aware of how much alcohol is in your drink. In the [place] one drink equals one 12 oz bottle of beer (355 mL), one 5 oz glass of wine (148 mL), or one 1 oz glass of hard liquor (44 mL). General instructions  Schedule regular health, dental, and eye exams.  Stay current with your vaccines.  Tell your health care provider if: ? You often feel depressed. ? You have ever been abused or do not feel safe at home. Summary  Adopting a healthy lifestyle and getting preventive care are important in promoting health and wellness.  Follow your health care provider's instructions about healthy diet, exercising, and getting tested or screened for diseases.  Follow your health care provider's instructions on monitoring your cholesterol and blood pressure. This information is not intended to replace advice given to you by your health care provider. Make sure you discuss any questions you have with your health care provider. Document Revised: 06/30/2018 Document Reviewed: 06/30/2018 Elsevier Patient Education  2020 [employer]. Levonorgestrel intrauterine device (IUD) What is this medicine? LEVONORGESTREL IUD (LEE voe nor jes trel) is a contraceptive (birth control) device. The device is placed inside the uterus by a healthcare professional. It is used to prevent pregnancy. This device can also be used to treat heavy bleeding that occurs during your period. This medicine may be used for other purposes; ask your health care provider or pharmacist if you have questions. COMMON BRAND NAME(S): Cameron Ali What should I tell my health care provider before I take this medicine? They need to know if you have any of these conditions:  abnormal Pap smear  cancer of the breast, uterus, or cervix  diabetes  endometritis  genital or  pelvic infection now or in the past  have more than one sexual partner or your partner has more than one partner  heart disease  history of an ectopic or tubal pregnancy  immune system problems  IUD in place  liver disease or tumor  problems with blood clots or take blood-thinners  seizures  use intravenous drugs  uterus of unusual shape  vaginal bleeding that has not been explained  an unusual or allergic reaction to levonorgestrel, other hormones, silicone, or polyethylene, medicines, foods, dyes, or preservatives  pregnant or trying to get pregnant  breast-feeding How should I use this medicine? This device is placed inside the uterus by a health care professional. Talk  to your pediatrician regarding the use of this medicine in children. Special care may be needed. Overdosage: If you think you have taken too much of this medicine contact a poison control center or emergency room at once. NOTE: This medicine is only for you. Do not share this medicine with others. What if I miss a dose? This does not apply. Depending on the brand of device you have inserted, the device will need to be replaced every 3 to 6 years if you wish to continue using this type of birth control. What may interact with this medicine? Do not take this medicine with any of the following medications:  amprenavir  bosentan  fosamprenavir This medicine may also interact with the following medications:  aprepitant  armodafinil  barbiturate medicines for inducing sleep or treating seizures  bexarotene  boceprevir  griseofulvin  medicines to treat seizures like carbamazepine, ethotoin, felbamate, oxcarbazepine, phenytoin, topiramate  modafinil  pioglitazone  rifabutin  rifampin  rifapentine  some medicines to treat HIV infection like atazanavir, efavirenz, indinavir, lopinavir, nelfinavir, tipranavir, ritonavir  St. John's wort  warfarin This list may not describe all  possible interactions. Give your health care provider a list of all the medicines, herbs, non-prescription drugs, or dietary supplements you use. Also tell them if you smoke, drink alcohol, or use illegal drugs. Some items may interact with your medicine. What should I watch for while using this medicine? Visit your doctor or health care professional for regular check ups. See your doctor if you or your partner has sexual contact with others, becomes HIV positive, or gets a sexual transmitted disease. This product does not protect you against HIV infection (AIDS) or other sexually transmitted diseases. You can check the placement of the IUD yourself by reaching up to the top of your vagina with clean fingers to feel the threads. Do not pull on the threads. It is a good habit to check placement after each menstrual period. Call your doctor right away if you feel more of the IUD than just the threads or if you cannot feel the threads at all. The IUD may come out by itself. You may become pregnant if the device comes out. If you notice that the IUD has come out use a backup birth control method like condoms and call your health care provider. Using tampons will not change the position of the IUD and are okay to use during your period. This IUD can be safely scanned with magnetic resonance imaging (MRI) only under specific conditions. Before you have an MRI, tell your healthcare provider that you have an IUD in place, and which type of IUD you have in place. What side effects may I notice from receiving this medicine? Side effects that you should report to your doctor or health care professional as soon as possible:  allergic reactions like skin rash, itching or hives, swelling of the face, lips, or tongue  fever, flu-like symptoms  genital sores  high blood pressure  no menstrual period for 6 weeks during use  pain, swelling, warmth in the leg  pelvic pain or tenderness  severe or sudden  headache  signs of pregnancy  stomach cramping  sudden shortness of breath  trouble with balance, talking, or walking  unusual vaginal bleeding, discharge  yellowing of the eyes or skin Side effects that usually do not require medical attention (report to your doctor or health care professional if they continue or are bothersome):  acne  breast pain  change in  sex drive or performance  changes in weight  cramping, dizziness, or faintness while the device is being inserted  headache  irregular menstrual bleeding within first 3 to 6 months of use  nausea This list may not describe all possible side effects. Call your doctor for medical advice about side effects. You may report side effects to FDA at 1-800-FDA-1088. Where should I keep my medicine? This does not apply. NOTE: This sheet is a summary. It may not cover all possible information. If you have questions about this medicine, talk to your doctor, pharmacist, or health care provider.  2020 Elsevier/Gold Standard (2018-05-18 13:22:01) Intrauterine Device Insertion An intrauterine device (IUD) is a medical device that gets inserted into the uterus to prevent pregnancy. It is a small, T-shaped device that has one or two nylon strings hanging down from it. The strings hang out of the lower part of the uterus (cervix) to allow for future IUD removal. There are two types of IUDs available:  Copper IUD. This type of IUD has copper wire wrapped around it. Copper makes the uterus and fallopian tubes produce a fluid that kills sperm. A copper IUD may last up to 10 years.  Hormone IUD. This type of IUD is made of plastic and contains the hormone progestin (synthetic progesterone). The hormone thickens mucus in the cervix and prevents sperm from entering the uterus. It also thins the uterine lining to prevent implantation of a fertilized egg. The hormone can weaken or kill the sperm that get into the uterus. A hormone IUD may last  3-5 years. Tell a health care provider about:  Any allergies you have.  All medicines you are taking, including vitamins, herbs, eye drops, creams, and over-the-counter medicines.  Any problems you or family members have had with anesthetic medicines.  Any blood disorders you have.  Any surgeries you have had.  Any medical conditions you have, including any STIs (sexually transmitted infections) you may have.  Whether you are pregnant or may be pregnant. What are the risks? Generally, this is a safe procedure. However, problems may occur, including:  Infection.  Bleeding.  Allergic reactions to medicines.  Accidental puncture (perforation) of the uterus, or damage to other structures or organs.  Accidental placement of the IUD either in the muscle layer of the uterus (myometrium) or outside the uterus.  The IUD falling out of the uterus (expulsion). This is more common among women who have recently had a child.  Pregnancy that happens in the fallopian tube (ectopic pregnancy).  Infection of the uterus and fallopian tubes (pelvic inflammatory disease). What happens before the procedure?  Schedule the IUD insertion for when you will have your menstrual period or right after, to make sure you are not pregnant. Placement of the IUD is better tolerated shortly after a menstrual cycle.  Follow instructions from your health care provider about eating or drinking restrictions.  Ask your health care provider about changing or stopping your regular medicines. This is especially important if you are taking diabetes medicines or blood thinners.  You may get a pain reliever to take before the procedure.  You may have tests for: ? Pregnancy. A pregnancy test involves having a urine sample taken. ? STIs. Placing an IUD in someone who has an STI can make the infection worse. ? Cervical cancer. You may have a Pap test to check for this type of cancer. This means collecting cells from  your cervix to be examined under a microscope.  You may  have a physical exam to determine the size and position of your uterus. The procedure may vary among health care providers and hospitals. What happens during the procedure?  A tool (speculum) will be placed in your vagina and widened so that your health care provider can see your cervix.  Medicine may be applied to your cervix to help lower your risk of infection (antiseptic medicine).  You may be given an anesthetic medicine to numb each side of your cervix (intracervical block or paracervical block). This medicine is usually given by an injection into the cervix.  A tool (uterine sound) will be inserted into your uterus to determine the length of your uterus and the direction that your uterus may be tilted.  A slim instrument or tube (IUD inserter) that holds the IUD will be inserted into your vagina, through your cervical canal, and into your uterus.  The IUD will be placed in the uterus, and the IUD inserter will be removed.  The strings that are attached to the IUD will be trimmed so that they lie just below the cervix. The procedure may vary among health care providers and hospitals. What happens after the procedure?  You may have bleeding after the procedure. This is normal. It varies from light bleeding (spotting) for a few days to menstrual-like bleeding.  You may have cramping and pain.  You may feel dizzy or light-headed.  You may have lower back pain. Summary  An intrauterine device (IUD) is a small, T-shaped device that has one or two nylon strings hanging down from it.  Two types of IUDs are available. You may have a copper IUD or a hormone IUD.  Schedule the IUD insertion for when you will have your menstrual period or right after, to make sure you are not pregnant. Placement of the IUD is better tolerated shortly after a menstrual cycle.  You may have bleeding after the procedure. This is normal. It varies  from light spotting for a few days to menstrual-like bleeding. This information is not intended to replace advice given to you by your health care provider. Make sure you discuss any questions you have with your health care provider. Document Revised: 06/19/2017 Document Reviewed: 05/28/2016 Elsevier Patient Education  2020 [employer] Etonogestrel implant What is this medicine? ETONOGESTREL (et oh noe JES trel) is a contraceptive (birth control) device. It is used to prevent pregnancy. It can be used for up to 3 years. This medicine may be used for other purposes; ask your health care provider or pharmacist if you have questions. COMMON BRAND NAME(S): Implanon, Nexplanon What should I tell my health care provider before I take this medicine? They need to know if you have any of these conditions:  abnormal vaginal bleeding  blood vessel disease or blood clots  breast, cervical, endometrial, ovarian, liver, or uterine cancer  diabetes  gallbladder disease  heart disease or recent heart attack  high blood pressure  high cholesterol or triglycerides  kidney disease  liver disease  migraine headaches  seizures  stroke  tobacco smoker  an unusual or allergic reaction to etonogestrel, anesthetics or antiseptics, other medicines, foods, dyes, or preservatives  pregnant or trying to get pregnant  breast-feeding How should I use this medicine? This device is inserted just under the skin on the inner side of your upper arm by a health care professional. Talk to your pediatrician regarding the use of this medicine in children. Special care may be needed. Overdosage: If you think you  have taken too much of this medicine contact a poison control center or emergency room at once. NOTE: This medicine is only for you. Do not share this medicine with others. What if I miss a dose? This does not apply. What may interact with this medicine? Do not take this medicine with any of  the following medications:  amprenavir  fosamprenavir This medicine may also interact with the following medications:  acitretin  aprepitant  armodafinil  bexarotene  bosentan  carbamazepine  certain medicines for fungal infections like fluconazole, ketoconazole, itraconazole and voriconazole  certain medicines to treat hepatitis, HIV or AIDS  cyclosporine  felbamate  griseofulvin  lamotrigine  modafinil  oxcarbazepine  phenobarbital  phenytoin  primidone  rifabutin  rifampin  rifapentine  St. John's wort  topiramate This list may not describe all possible interactions. Give your health care provider a list of all the medicines, herbs, non-prescription drugs, or dietary supplements you use. Also tell them if you smoke, drink alcohol, or use illegal drugs. Some items may interact with your medicine. What should I watch for while using this medicine? This product does not protect you against HIV infection (AIDS) or other sexually transmitted diseases. You should be able to feel the implant by pressing your fingertips over the skin where it was inserted. Contact your doctor if you cannot feel the implant, and use a non-hormonal birth control method (such as condoms) until your doctor confirms that the implant is in place. Contact your doctor if you think that the implant may have broken or become bent while in your arm. You will receive a user card from your health care provider after the implant is inserted. The card is a record of the location of the implant in your upper arm and when it should be removed. Keep this card with your health records. What side effects may I notice from receiving this medicine? Side effects that you should report to your doctor or health care professional as soon as possible:  allergic reactions like skin rash, itching or hives, swelling of the face, lips, or tongue  breast lumps, breast tissue changes, or discharge  breathing  problems  changes in emotions or moods  coughing up blood  if you feel that the implant may have broken or bent while in your arm  high blood pressure  pain, irritation, swelling, or bruising at the insertion site  scar at site of insertion  signs of infection at the insertion site such as fever, and skin redness, pain or discharge  signs and symptoms of a blood clot such as breathing problems; changes in vision; chest pain; severe, sudden headache; pain, swelling, warmth in the leg; trouble speaking; sudden numbness or weakness of the face, arm or leg  signs and symptoms of liver injury like dark yellow or brown urine; general ill feeling or flu-like symptoms; light-colored stools; loss of appetite; nausea; right upper belly pain; unusually weak or tired; yellowing of the eyes or skin  unusual vaginal bleeding, discharge Side effects that usually do not require medical attention (report to your doctor or health care professional if they continue or are bothersome):  acne  breast pain or tenderness  headache  irregular menstrual bleeding  nausea This list may not describe all possible side effects. Call your doctor for medical advice about side effects. You may report side effects to FDA at 1-800-FDA-1088. Where should I keep my medicine? This drug is given in a hospital or clinic and will not be  stored at home. NOTE: This sheet is a summary. It may not cover all possible information. If you have questions about this medicine, talk to your doctor, pharmacist, or health care provider.  2020 Elsevier/Gold Standard (2019-04-19 11:33:04)

## 2020-02-06 ENCOUNTER — Encounter: Payer: Self-pay | Admitting: Adult Health

## 2020-02-07 ENCOUNTER — Telehealth: Payer: Self-pay

## 2020-02-07 DIAGNOSIS — H10233 Serous conjunctivitis, except viral, bilateral: Secondary | ICD-10-CM | POA: Diagnosis not present

## 2020-02-07 NOTE — Telephone Encounter (Signed)
Yes agree patient would need an appt

## 2020-02-07 NOTE — Telephone Encounter (Signed)
Advised PEC that patient would need to be seen

## 2020-02-07 NOTE — Telephone Encounter (Signed)
Pt. Reports she has pink eye that she caught from her daughter. Can not missed another day of work tomorrow for an office visit. Instructed she could go to UC or try a My Chart e-visit. Verbalizes understanding.

## 2020-02-29 ENCOUNTER — Encounter: Payer: Self-pay | Admitting: Adult Health

## 2020-06-26 ENCOUNTER — Other Ambulatory Visit (HOSPITAL_COMMUNITY)
Admission: RE | Admit: 2020-06-26 | Discharge: 2020-06-26 | Disposition: A | Discharge status: Home / Self Care | Payer: 59 | Source: Ambulatory Visit | Attending: Advanced Practice Midwife | Admitting: Advanced Practice Midwife

## 2020-06-26 ENCOUNTER — Encounter: Payer: Self-pay | Admitting: Advanced Practice Midwife

## 2020-06-26 ENCOUNTER — Other Ambulatory Visit: Payer: Self-pay

## 2020-06-26 ENCOUNTER — Ambulatory Visit (INDEPENDENT_AMBULATORY_CARE_PROVIDER_SITE_OTHER): Payer: 59 | Admitting: Advanced Practice Midwife

## 2020-06-26 VITALS — BP 112/70 | Ht 61.0 in | Wt 186.4 lb

## 2020-06-26 DIAGNOSIS — Z113 Encounter for screening for infections with a predominantly sexual mode of transmission: Secondary | ICD-10-CM | POA: Diagnosis not present

## 2020-06-26 DIAGNOSIS — O219 Vomiting of pregnancy, unspecified: Secondary | ICD-10-CM

## 2020-06-26 DIAGNOSIS — Z124 Encounter for screening for malignant neoplasm of cervix: Secondary | ICD-10-CM | POA: Diagnosis not present

## 2020-06-26 DIAGNOSIS — Z3481 Encounter for supervision of other normal pregnancy, first trimester: Secondary | ICD-10-CM | POA: Diagnosis not present

## 2020-06-26 DIAGNOSIS — N926 Irregular menstruation, unspecified: Secondary | ICD-10-CM | POA: Diagnosis not present

## 2020-06-26 DIAGNOSIS — O99211 Obesity complicating pregnancy, first trimester: Secondary | ICD-10-CM | POA: Diagnosis not present

## 2020-06-26 DIAGNOSIS — Z3A08 8 weeks gestation of pregnancy: Secondary | ICD-10-CM

## 2020-06-26 DIAGNOSIS — O0990 Supervision of high risk pregnancy, unspecified, unspecified trimester: Secondary | ICD-10-CM | POA: Insufficient documentation

## 2020-06-26 DIAGNOSIS — Z3490 Encounter for supervision of normal pregnancy, unspecified, unspecified trimester: Secondary | ICD-10-CM | POA: Insufficient documentation

## 2020-06-26 LAB — POCT URINALYSIS DIPSTICK OB
Glucose, UA: NEGATIVE
POC,PROTEIN,UA: NEGATIVE

## 2020-06-26 LAB — POCT URINE PREGNANCY: Preg Test, Ur: POSITIVE — AB

## 2020-06-26 MED ORDER — ONDANSETRON 4 MG PO TBDP: 4.0000 mg | ORAL_TABLET | Freq: Four times a day (QID) | ORAL | 2 refills | Status: DC | PRN

## 2020-06-26 NOTE — Progress Notes (Signed)
New Obstetric Patient H&P    Chief Complaint: "Desires prenatal care"   History of Present Illness: Patient is a 24 y.o. G2P1001 Not Hispanic or Latino female, presents with amenorrhea and positive home pregnancy test. Patient's last menstrual period was 04/30/2020 (exact date). and based on her  LMP, her EDD is Estimated Date of Delivery: 02/04/21 and her EGA is [redacted]w[redacted]d. Cycles are 5 days, regular, and occur approximately every : 1-3 months. She has never had a PAP smear. First PAP today.   She had a urine pregnancy test which was positive 4 week(s)  ago. Her last menstrual period was normal and lasted for  5 day(s). Since her LMP she claims she has experienced breast tenderness, fatigue, nausea, vomiting. She had one episode of post coital bleeding a couple weeks ago. Her past medical history is noncontributory. Her prior pregnancies are notable for G1 2019 FT SVD female 6#11oz  Since her LMP, she admits to the use of tobacco products  no She claims she has gained a few pounds since the start of her pregnancy.  There are cats in the home in the home  no  She admits close contact with children on a regular basis  yes  She has had chicken pox in the past no She has had Tuberculosis exposures, symptoms, or previously tested positive for TB   no Current or past history of domestic violence. no  Genetic Screening/Teratology Counseling: (Includes patient, baby's father, or anyone in either family with:)   1. Patient's age >/= 70 at [place] [place]  no 2. Thalassemia ([place], [place], Mediterranean, or Asian background): MCV<80  no 3. Neural tube defect (meningomyelocele, spina bifida, anencephaly)  no 4. Congenital heart defect  no  5. Down syndrome  no 6. Tay-Sachs (Jewish, [place]  no 7. Canavan's Disease  no 8. Sickle cell disease or trait (African)  no  9. Hemophilia or other blood disorders  no  10. Muscular dystrophy  no  11. Cystic fibrosis  no  12. Huntington's Chorea  no  13. Mental  retardation/autism  no 14. Other inherited genetic or chromosomal disorder  no 15. Maternal metabolic disorder (DM, PKU, etc)  no 16. Patient or FOB with a child with a birth defect not listed above no  16a. Patient or FOB with a birth defect themselves no 17. Recurrent pregnancy loss, or stillbirth  no  18. Any medications since LMP other than prenatal vitamins (include vitamins, supplements, OTC meds, drugs, alcohol)  no 19. Any other genetic/environmental exposure to discuss  no  Infection History:   1. Lives with someone with TB or TB exposed  no  2. Patient or partner has history of genital herpes  no 3. Rash or viral illness since LMP  no 4. History of STI (GC, CT, HPV, syphilis, HIV)  no 5. History of recent travel :  no  Other pertinent information:  no    Review of Systems:10 point review of systems negative unless otherwise noted in HPI  Past Medical History:  Patient Active Problem List   Diagnosis Date Noted  . Supervision of normal pregnancy 06/26/2020    Clinic [facility]renatal [facility]  Dating  Blood type:     Genetic Screen 1 Screen:    AFP:     Quad:     NIPS: Antibody:   Anatomic [place]  Rubella:    Varicella: @VZVIGG @  GTT Early:                Third  trimester:  RPR:     Rhogam  HBsAg:     Vaccines TDAP:                       Flu Shot: Covid: HIV:     Baby Food                                GBS:   GC/CT:  Contraception  Pap: 06/26/20  CBB     CS/VBAC    Support Person [name]       . Obesity affecting pregnancy in first trimester 06/26/2020  . Atypical pigmented skin lesion 01/25/2020  . Birth control counseling 01/25/2020  . Insomnia 01/25/2020  . Depression with anxiety 01/25/2020  . Body mass index (BMI) of 35.0-35.9 in adult 01/25/2020  . Freckled skin 01/25/2020  . Inflamed skin tag 01/25/2020  . Rubella non-immune status, antepartum 07/06/2017  . Maternal varicella, non-immune 07/06/2017    Past Surgical History:  Past Surgical History:    Procedure Laterality Date  . WISDOM TOOTH EXTRACTION      Gynecologic History: Patient's last menstrual period was 04/30/2020 (exact date).  Obstetric History: G2P1001  Family History:  Family History  Problem Relation Age of Onset  . Diabetes Mother   . Cancer Paternal Grandfather   . Heart disease Paternal Grandfather   . Obesity Father   . Cancer Maternal Grandmother     Social History:  Social History   Socioeconomic History  . Marital status: Married    Spouse name: [name]  . Number of children: 1  . Years of education: Not on file  . Highest education level: Not on file  Occupational History  . Not on file  Tobacco Use  . Smoking status: Never Smoker  . Smokeless tobacco: Never Used  Vaping Use  . Vaping Use: Never used  Substance and Sexual Activity  . Alcohol use: No  . Drug use: No  . Sexual activity: Yes    Birth control/protection: None  Other Topics Concern  . Not on file  Social History Narrative  . Not on file   Social Determinants of Health   Financial Resource Strain:   . Difficulty of Paying Living Expenses: Not on file  Food Insecurity:   . Worried About Programme researcher, broadcasting/film/video in the Last Year: Not on file  . Ran Out of Food in the Last Year: Not on file  Transportation Needs:   . Lack of Transportation (Medical): Not on file  . Lack of Transportation (Non-Medical): Not on file  Physical Activity:   . Days of Exercise per Week: Not on file  . Minutes of Exercise per Session: Not on file  Stress:   . Feeling of Stress : Not on file  Social Connections:   . Frequency of Communication with Friends and Family: Not on file  . Frequency of Social Gatherings with Friends and Family: Not on file  . Attends Religious Services: Not on file  . Active Member of Clubs or Organizations: Not on file  . Attends Banker Meetings: Not on file  . Marital Status: Not on file  Intimate Partner Violence:   . Fear of Current or Ex-Partner: Not  on file  . Emotionally Abused: Not on file  . Physically Abused: Not on file  . Sexually Abused: Not on file    Allergies:  No Known Allergies  Medications: Prior  to Admission medications   Medication Sig Start Date End Date Taking? Authorizing Provider  escitalopram (LEXAPRO) 10 MG tablet Take 1 tablet (10 mg total) by mouth daily. Patient not taking: Reported on 06/26/2020 01/25/20   Flinchum, Eula FriedMichelle S, FNP  ondansetron (ZOFRAN ODT) 4 MG disintegrating tablet Take 1 tablet (4 mg total) by mouth every 6 (six) hours as needed for nausea. 06/26/20   Tresea MallGledhill, Renold Kozar, CNM    Physical Exam Vitals: Blood pressure 112/70, height 5\' 1"  (1.549 m), weight 186 lb 6.4 oz (84.6 kg), last menstrual period 04/30/2020.  General: NAD HEENT: normocephalic, anicteric Thyroid: no enlargement, no palpable nodules Pulmonary: No increased work of breathing, CTAB Cardiovascular: RRR, distal pulses 2+ Abdomen: NABS, soft, non-tender, non-distended.  Umbilicus without lesions.  No hepatomegaly, splenomegaly or masses palpable. No evidence of hernia  Genitourinary:  External: Normal external female genitalia.  Normal urethral meatus, normal Bartholin's and Skene's glands.    Vagina: Normal vaginal mucosa, no evidence of prolapse.    Cervix: Grossly normal in appearance, no bleeding, no CMT  Uterus:  Non-enlarged, mobile, normal contour.    Adnexa: ovaries non-enlarged, no adnexal masses  Rectal: deferred Extremities: no edema, erythema, or tenderness Neurologic: Grossly intact Psychiatric: mood appropriate, affect full  The following were addressed during this visit:  Breastfeeding Education - Early initiation of breastfeeding    Comments: Keeps milk supply adequate, helps contract uterus and slow bleeding, and early milk is the perfect first food and is easy to digest.   - The importance of exclusive breastfeeding    Comments: Provides antibodies, Lower risk of breast and ovarian cancers, and type-2  diabetes,Helps your body recover, Reduced chance of SIDS.   - Risks of giving your baby anything other than breast milk if you are breastfeeding    Comments: Make the baby less content with breastfeeds, may make my baby more susceptible to illness, and may reduce my milk supply.   - The importance of early skin-to-skin contact    Comments: Keeps baby warm and secure, helps keep baby's blood sugar up and breathing steady, easier to bond and breastfeed, and helps calm baby.  - Rooming-in on a 24-hour basis    Comments: Easier to learn baby's feeding cues, easier to bond and get to know each other, and encourages milk production.   - Feeding on demand or baby-led feeding    Comments: Helps prevent breastfeeding complications, helps bring in good milk supply, prevents under or overfeeding, and helps baby feel content and satisfied   - Frequent feeding to help assure optimal milk production    Comments: Making a full supply of milk requires frequent removal of milk from breasts, infant will eat 8-12 times in 24 hours, if separated from infant use breast massage, hand expression and/ or pumping to remove milk from breasts.   - Effective positioning and attachment    Comments: Helps my baby to get enough breast milk, helps to produce an adequate milk supply, and helps prevent nipple pain and damage   - Exclusive breastfeeding for the first 6 months    Comments: Builds a healthy milk supply and keeps it up, protects baby from sickness and disease, and breastmilk has everything your baby needs for the first 6 months.  - Individualized Education    Comments: Contraindications to breastfeeding and other special medical conditions Had a difficult time with first baby due to flat nipples and unable to find appropriate nipple shield.     Assessment: 24 y.o. G2P1001  at [redacted]w[redacted]d presenting to initiate prenatal care  Plan: 1) Avoid alcoholic beverages. 2) Patient encouraged not to smoke.  3)  Discontinue the use of all non-medicinal drugs and chemicals.  4) Take prenatal vitamins daily.  5) Nutrition, food safety (fish, cheese advisories, and high nitrite foods) and exercise discussed. 6) Hospital and practice style discussed with cross coverage system.  7) Genetic Screening, such as with 1st Trimester Screening, cell free fetal DNA, AFP testing, and Ultrasound, as well as with amniocentesis and CVS as appropriate, is discussed with patient. At the conclusion of today's visit patient requested genetic testing 8) Patient is asked about travel to areas at risk for the Zika virus, and counseled to avoid travel and exposure to mosquitoes or sexual partners who may have themselves been exposed to the virus. Testing is discussed, and will be ordered as appropriate.  9) PAPtima, urine culture, NOB panel, Hgb A1C today 10) Return to clinic in 1 week for dating scan and rob 59) MaterniT 21 at 10+ weeks 12) Rx zofran (unisom/B6 has not worked for her)   Tresea Mall, CNM [facility] 06/26/2020, 4:55 PM

## 2020-06-26 NOTE — Progress Notes (Signed)
NEW OB

## 2020-06-26 NOTE — Patient Instructions (Signed)
Exercise During Pregnancy Exercise is an important part of being healthy for people of all ages. Exercise improves the function of your heart and lungs and helps you maintain strength, flexibility, and a healthy body weight. Exercise also boosts energy levels and elevates mood. Most women should exercise regularly during pregnancy. In rare cases, women with certain medical conditions or complications may be asked to limit or avoid exercise during pregnancy. How does this affect me? Along with maintaining general strength and flexibility, exercising during pregnancy can help:  Keep strength in muscles that are used during labor and childbirth.  Decrease low back pain.  Reduce symptoms of depression.  Control weight gain during pregnancy.  Reduce the risk of needing insulin if you develop diabetes during pregnancy.  Decrease the risk of cesarean delivery.  Speed up your recovery after giving birth. How does this affect my baby? Exercise can help you have a healthy pregnancy. Exercise does not cause premature birth. It will not cause your baby to weigh less at birth. What exercises can I do? Many exercises are safe for you to do during pregnancy. Do a variety of exercises that safely increase your heart and breathing rates and help you build and maintain muscle strength. Do exercises exactly as told by your health care provider. You may do these exercises:  Walking or hiking.  Swimming.  Water aerobics.  Riding a stationary bike.  Strength training.  Modified yoga or Pilates. Tell your instructor that you are pregnant. Avoid overstretching, and avoid lying on your back for long periods of time.  Running or jogging. Only choose this type of exercise if you: ? Ran or jogged regularly before your pregnancy. ? Can run or jog and still talk in complete sentences. What exercises should I avoid? Depending on your level of fitness and whether you exercised regularly before your  pregnancy, you may be told to limit high-intensity exercise. You can tell that you are exercising at a high intensity if you are breathing much harder and faster and cannot hold a conversation while exercising. You must avoid:  Contact sports.  Activities that put you at risk for falling on or being hit in the belly, such as downhill skiing, water skiing, surfing, rock climbing, cycling, gymnastics, and horseback riding.  Scuba diving.  Skydiving.  Yoga or Pilates in a room that is heated to high temperatures.  Jogging or running, unless you ran or jogged regularly before your pregnancy. While jogging or running, you should always be able to talk in full sentences. Do not run or jog so fast that you are unable to have a conversation.  Do not exercise at more than 6,000 feet above sea level (high elevation) if you are not used to exercising at high elevation. How do I exercise in a safe way?   Avoid overheating. Do not exercise in very high temperatures.  Wear loose-fitting, breathable clothes.  Avoid dehydration. Drink enough water before, during, and after exercise to keep your urine pale yellow.  Avoid overstretching. Because of hormone changes during pregnancy, it is easy to overstretch muscles, tendons, and ligaments during pregnancy.  Start slowly and ask your health care provider to recommend the types of exercise that are safe for you.  Do not exercise to lose weight. Follow these instructions at home:  Exercise on most days or all days of the week. Try to exercise for 30 minutes a day, 5 days a week, unless your health care provider tells you not to.  If   you actively exercised before your pregnancy and you are healthy, your health care provider may tell you to continue to do moderate to high-intensity exercise.  If you are just starting to exercise or did not exercise much before your pregnancy, your health care provider may tell you to do low to moderate-intensity  exercise. Questions to ask your health care provider  Is exercise safe for me?  What are signs that I should stop exercising?  Does my health condition mean that I should not exercise during pregnancy?  When should I avoid exercising during pregnancy? Stop exercising and contact a health care provider if: You have any unusual symptoms, such as:  Mild contractions of the uterus or cramps in the abdomen.  Dizziness that does not go away when you rest. Stop exercising and get help right away if: You have any unusual symptoms, such as:  Sudden, severe pain in your low back or your belly.  Mild contractions of the uterus or cramps in the abdomen that do not improve with rest and drinking fluids.  Chest pain.  Bleeding or fluid leaking from your vagina.  Shortness of breath. These symptoms may represent a serious problem that is an emergency. Do not wait to see if the symptoms will go away. Get medical help right away. Call your local emergency services (911 in the [place] Do not drive yourself to the hospital. Summary  Most women should exercise regularly throughout pregnancy. In rare cases, women with certain medical conditions or complications may be asked to limit or avoid exercise during pregnancy.  Do not exercise to lose weight during pregnancy.  Your health care provider will tell you what level of physical activity is right for you.  Stop exercising and contact a health care provider if you have mild contractions of the uterus or cramps in the abdomen. Get help right away if these contractions or cramps do not improve with rest and drinking fluids.  Stop exercising and get help right away if you have sudden, severe pain in your low back or belly, chest pain, shortness of breath, or bleeding or leaking of fluid from your vagina. This information is not intended to replace advice given to you by your health care provider. Make sure you discuss any questions you have with your  health care provider. Document Revised: 10/28/2018 Document Reviewed: 08/11/2018 Elsevier Patient Education  2020 [employer] Eating Plan for Pregnant Women While you are pregnant, your body requires additional nutrition to help support your growing baby. You also have a higher need for some vitamins and minerals, such as folic acid, calcium, iron, and vitamin D. Eating a healthy, well-balanced diet is very important for your health and your baby's health. Your need for extra calories varies for the three 3-month segments of your pregnancy (trimesters). For most women, it is recommended to consume:  150 extra calories a day during the first trimester.  300 extra calories a day during the second trimester.  300 extra calories a day during the third trimester. What are tips for following this plan?   Do not try to lose weight or go on a diet during pregnancy.  Limit your overall intake of foods that have "empty calories." These are foods that have little nutritional value, such as sweets, desserts, candies, and sugar-sweetened beverages.  Eat a variety of foods (especially fruits and vegetables) to get a full range of vitamins and minerals.  Take a prenatal vitamin to help meet your additional vitamin and mineral needs   during pregnancy, specifically for folic acid, iron, calcium, and vitamin D.  Remember to stay active. Ask your health care provider what types of exercise and activities are safe for you.  Practice good food safety and cleanliness. Wash your hands before you eat and after you prepare raw meat. Wash all fruits and vegetables well before peeling or eating. Taking these actions can help to prevent food-borne illnesses that can be very dangerous to your baby, such as listeriosis. Ask your health care provider for more information about listeriosis. What does 150 extra calories look like? Healthy options that provide 150 extra calories each day could be any of the  following:  6-8 oz (170-230 g) of plain low-fat yogurt with  cup of berries.  1 apple with 2 teaspoons (11 g) of peanut butter.  Cut-up vegetables with  cup (60 g) of hummus.  8 oz (230 mL) or 1 cup of low-fat chocolate milk.  1 stick of string cheese with 1 medium orange.  1 peanut butter and jelly sandwich that is made with one slice of whole-wheat bread and 1 tsp (5 g) of peanut butter. For 300 extra calories, you could eat two of those healthy options each day. What is a healthy amount of weight to gain? The right amount of weight gain for you is based on your BMI before you became pregnant. If your BMI:  Was less than 18 (underweight), you should gain 28-40 lb (13-18 kg).  Was 18-24.9 (normal), you should gain 25-35 lb (11-16 kg).  Was 25-29.9 (overweight), you should gain 15-25 lb (7-11 kg).  Was 30 or greater (obese), you should gain 11-20 lb (5-9 kg). What if I am having twins or multiples? Generally, if you are carrying twins or multiples:  You may need to eat 300-600 extra calories a day.  The recommended range for total weight gain is 25-54 lb (11-25 kg), depending on your BMI before pregnancy.  Talk with your health care provider to find out about nutritional needs, weight gain, and exercise that is right for you. What foods can I eat?  Fruits All fruits. Eat a variety of colors and types of fruit. Remember to wash your fruits well before peeling or eating. Vegetables All vegetables. Eat a variety of colors and types of vegetables. Remember to wash your vegetables well before peeling or eating. Grains All grains. Choose whole grains, such as whole-wheat bread, oatmeal, or brown rice. Meats and other protein foods Lean meats, including chicken, turkey, fish, and lean cuts of beef, veal, or pork. If you eat fish or seafood, choose options that are higher in omega-3 fatty acids and lower in mercury, such as salmon, herring, mussels, trout, sardines, pollock,  shrimp, crab, and lobster. Tofu. Tempeh. Beans. Eggs. Peanut butter and other nut butters. Make sure that all meats, poultry, and eggs are cooked to food-safe temperatures or "well-done." Two or more servings of fish are recommended each week in order to get the most benefits from omega-3 fatty acids that are found in seafood. Choose fish that are lower in mercury. You can find more information online:  www.fda.gov Dairy Pasteurized milk and milk alternatives (such as almond milk). Pasteurized yogurt and pasteurized cheese. Cottage cheese. Sour cream. Beverages Water. Juices that contain 100% fruit juice or vegetable juice. Caffeine-free teas and decaffeinated coffee. Drinks that contain caffeine are okay to drink, but it is better to avoid caffeine. Keep your total caffeine intake to less than 200 mg each day (which is 12 oz   or 355 mL of coffee, tea, or soda) or the limit as told by your health care provider. Fats and oils Fats and oils are okay to include in moderation. Sweets and desserts Sweets and desserts are okay to include in moderation. Seasoning and other foods All pasteurized condiments. The items listed above may not be a complete list of foods and beverages you can eat. Contact a dietitian for more information. What foods are not recommended? Fruits Unpasteurized fruit juices. Vegetables Raw (unpasteurized) vegetable juices. Meats and other protein foods Lunch meats, bologna, hot dogs, or other deli meats. (If you must eat those meats, reheat them until they are steaming hot.) Refrigerated pat, meat spreads from a meat counter, smoked seafood that is found in the refrigerated section of a store. Raw or undercooked meats, poultry, and eggs. Raw fish, such as sushi or sashimi. Fish that have high mercury content, such as tilefish, shark, swordfish, and king mackerel. To learn more about mercury in fish, talk with your health care provider or look for online resources, such  as:  www.fda.gov Dairy Raw (unpasteurized) milk and any foods that have raw milk in them. Soft cheeses, such as feta, queso blanco, queso fresco, Brie, Camembert cheeses, blue-veined cheeses, and Panela cheese (unless it is made with pasteurized milk, which must be stated on the label). Beverages Alcohol. Sugar-sweetened beverages, such as sodas, teas, or energy drinks. Seasoning and other foods Homemade fermented foods and drinks, such as pickles, sauerkraut, or kombucha drinks. (Store-bought pasteurized versions of these are okay.) Salads that are made in a store or deli, such as ham salad, chicken salad, egg salad, tuna salad, and seafood salad. The items listed above may not be a complete list of foods and beverages you should avoid. Contact a dietitian for more information. Where to find more information To calculate the number of calories you need based on your height, weight, and activity level, you can use an online calculator such as:  www.choosemyplate.gov/MyPlatePlan To calculate how much weight you should gain during pregnancy, you can use an online pregnancy weight gain calculator such as:  www.choosemyplate.gov/pregnancy-weight-gain-calculator Summary  While you are pregnant, your body requires additional nutrition to help support your growing baby.  Eat a variety of foods, especially fruits and vegetables to get a full range of vitamins and minerals.  Practice good food safety and cleanliness. Wash your hands before you eat and after you prepare raw meat. Wash all fruits and vegetables well before peeling or eating. Taking these actions can help to prevent food-borne illnesses, such as listeriosis, that can be very dangerous to your baby.  Do not eat raw meat or fish. Do not eat fish that have high mercury content, such as tilefish, shark, swordfish, and king mackerel. Do not eat unpasteurized (raw) dairy.  Take a prenatal vitamin to help meet your additional vitamin and  mineral needs during pregnancy, specifically for folic acid, iron, calcium, and vitamin D. This information is not intended to replace advice given to you by your health care provider. Make sure you discuss any questions you have with your health care provider. Document Revised: 11/25/2018 Document Reviewed: 04/03/2017 Elsevier Patient Education  2020 Else[employer] Inc. Prenatal Care Prenatal care is health care during pregnancy. It helps you and your unborn baby (fetus) stay as healthy as possible. Prenatal care may be provided by a midwife, a family practice health care provider, or a childbirth and pregnancy specialist (obstetrician). How does this affect me? During pregnancy, you will be closely monitored   for any new conditions that might develop. To lower your risk of pregnancy complications, you and your health care provider will talk about any underlying conditions you have. How does this affect my baby? Early and consistent prenatal care increases the chance that your baby will be healthy during pregnancy. Prenatal care lowers the risk that your baby will be:  Born early (prematurely).  Smaller than expected at birth (small for gestational age). What can I expect at the first prenatal care visit? Your first prenatal care visit will likely be the longest. You should schedule your first prenatal care visit as soon as you know that you are pregnant. Your first visit is a good time to talk about any questions or concerns you have about pregnancy. At your visit, you and your health care provider will talk about:  Your medical history, including: ? Any past pregnancies. ? Your family's medical history. ? The baby's father's medical history. ? Any long-term (chronic) health conditions you have and how you manage them. ? Any surgeries or procedures you have had. ? Any current over-the-counter or prescription medicines, herbs, or supplements you are taking.  Other factors that could pose a risk  to your baby, including:  Your home setting and your stress levels, including: ? Exposure to abuse or violence. ? Household financial strain. ? Mental health conditions you have.  Your daily health habits, including diet and exercise. Your health care provider will also:  Measure your weight, height, and blood pressure.  Do a physical exam, including a pelvic and breast exam.  Perform blood tests and urine tests to check for: ? Urinary tract infection. ? Sexually transmitted infections (STIs). ? Low iron levels in your blood (anemia). ? Blood type and certain proteins on red blood cells (Rh antibodies). ? Infections and immunity to viruses, such as hepatitis B and rubella. ? HIV (human immunodeficiency virus).  Do an ultrasound to confirm your baby's growth and development and to help predict your estimated due date (EDD). This ultrasound is done with a probe that is inserted into the vagina (transvaginal ultrasound).  Discuss your options for genetic screening.  Give you information about how to keep yourself and your baby healthy, including: ? Nutrition and taking vitamins. ? Physical activity. ? How to manage pregnancy symptoms such as nausea and vomiting (morning sickness). ? Infections and substances that may be harmful to your baby and how to avoid them. ? Food safety. ? Dental care. ? Working. ? Travel. ? Warning signs to watch for and when to call your health care provider. How often will I have prenatal care visits? After your first prenatal care visit, you will have regular visits throughout your pregnancy. The visit schedule is often as follows:  Up to week 28 of pregnancy: once every 4 weeks.  28-36 weeks: once every 2 weeks.  After 36 weeks: every week until delivery. Some women may have visits more or less often depending on any underlying health conditions and the health of the baby. Keep all follow-up and prenatal care visits as told by your health care  provider. This is important. What happens during routine prenatal care visits? Your health care provider will:  Measure your weight and blood pressure.  Check for fetal heart sounds.  Measure the height of your uterus in your abdomen (fundal height). This may be measured starting around week 20 of pregnancy.  Check the position of your baby inside your uterus.  Ask questions about your diet, sleeping patterns, and   whether you can feel the baby move.  Review warning signs to watch for and signs of labor.  Ask about any pregnancy symptoms you are having and how you are dealing with them. Symptoms may include: ? Headaches. ? Nausea and vomiting. ? Vaginal discharge. ? Swelling. ? Fatigue. ? Constipation. ? Any discomfort, including back or pelvic pain. Make a list of questions to ask your health care provider at your routine visits. What tests might I have during prenatal care visits? You may have blood, urine, and imaging tests throughout your pregnancy, such as:  Urine tests to check for glucose, protein, or signs of infection.  Glucose tests to check for a form of diabetes that can develop during pregnancy (gestational diabetes mellitus). This is usually done around week 24 of pregnancy.  An ultrasound to check your baby's growth and development and to check for birth defects. This is usually done around week 20 of pregnancy.  A test to check for group B strep (GBS) infection. This is usually done around week 36 of pregnancy.  Genetic testing. This may include blood or imaging tests, such as an ultrasound. Some genetic tests are done during the first trimester and some are done during the second trimester. What else can I expect during prenatal care visits? Your health care provider may recommend getting certain vaccines during pregnancy. These may include:  A yearly flu shot (annual influenza vaccine). This is especially important if you will be pregnant during flu  season.  Tdap (tetanus, diphtheria, pertussis) vaccine. Getting this vaccine during pregnancy can protect your baby from whooping cough (pertussis) after birth. This vaccine may be recommended between weeks 27 and 36 of pregnancy. Later in your pregnancy, your health care provider may give you information about:  Childbirth and breastfeeding classes.  Choosing a health care provider for your baby.  Umbilical cord banking.  Breastfeeding.  Birth control after your baby is born.  The hospital labor and delivery unit and how to tour it.  Registering at the hospital before you go into labor. Where to find more information  Office on Women's Health: womenshealth.gov  American Pregnancy Association: americanpregnancy.org  March of Dimes: marchofdimes.org Summary  Prenatal care helps you and your baby stay as healthy as possible during pregnancy.  Your first prenatal care visit will most likely be the longest.  You will have visits and tests throughout your pregnancy to monitor your health and your baby's health.  Bring a list of questions to your visits to ask your health care provider.  Make sure to keep all follow-up and prenatal care visits with your health care provider. This information is not intended to replace advice given to you by your health care provider. Make sure you discuss any questions you have with your health care provider. Document Revised: 10/27/2018 Document Reviewed: 07/06/2017 Elsevier Patient Education  2020 Elsevier Inc.  

## 2020-06-27 LAB — RPR+RH+ABO+RUB AB+AB SCR+CB...
Antibody Screen: NEGATIVE
HIV Screen 4th Generation wRfx: NONREACTIVE
Hematocrit: 37.5 % (ref 34.0–46.6)
Hemoglobin: 13.1 g/dL (ref 11.1–15.9)
Hepatitis B Surface Ag: NEGATIVE
MCH: 29.1 pg (ref 26.6–33.0)
MCHC: 34.9 g/dL (ref 31.5–35.7)
MCV: 83 fL (ref 79–97)
Platelets: 324 10*3/uL (ref 150–450)
RBC: 4.5 x10E6/uL (ref 3.77–5.28)
RDW: 13.2 % (ref 11.7–15.4)
RPR Ser Ql: NONREACTIVE
Rh Factor: POSITIVE
Rubella Antibodies, IGG: <0.9 index — ABNORMAL LOW (ref >0.99)
Varicella zoster IgG: <135 index — ABNORMAL LOW (ref >165)
WBC: 8.3 10*3/uL (ref 3.4–10.8)

## 2020-06-28 LAB — CYTOLOGY - PAP
Chlamydia: NEGATIVE
Comment: NEGATIVE
Comment: NEGATIVE
Comment: NORMAL
Diagnosis: NEGATIVE
Neisseria Gonorrhea: NEGATIVE
Trichomonas: NEGATIVE

## 2020-06-28 LAB — HGB A1C W/O EAG: Hgb A1c MFr Bld: 5 % (ref 4.8–5.6)

## 2020-06-28 LAB — SPECIMEN STATUS REPORT

## 2020-06-29 LAB — URINE CULTURE: Organism ID, Bacteria: NO GROWTH

## 2020-07-03 ENCOUNTER — Encounter: Payer: Self-pay | Admitting: Advanced Practice Midwife

## 2020-07-03 ENCOUNTER — Ambulatory Visit (INDEPENDENT_AMBULATORY_CARE_PROVIDER_SITE_OTHER): Payer: 59 | Admitting: Advanced Practice Midwife

## 2020-07-03 ENCOUNTER — Other Ambulatory Visit: Payer: Self-pay | Admitting: Advanced Practice Midwife

## 2020-07-03 ENCOUNTER — Ambulatory Visit (INDEPENDENT_AMBULATORY_CARE_PROVIDER_SITE_OTHER): Payer: 59

## 2020-07-03 ENCOUNTER — Other Ambulatory Visit: Payer: Self-pay

## 2020-07-03 VITALS — BP 112/68 | Wt 183.0 lb

## 2020-07-03 DIAGNOSIS — Z3481 Encounter for supervision of other normal pregnancy, first trimester: Secondary | ICD-10-CM

## 2020-07-03 DIAGNOSIS — Z3A09 9 weeks gestation of pregnancy: Secondary | ICD-10-CM

## 2020-07-03 DIAGNOSIS — Z3A08 8 weeks gestation of pregnancy: Secondary | ICD-10-CM | POA: Diagnosis not present

## 2020-07-03 NOTE — Progress Notes (Signed)
Routine Prenatal Care Visit  Subjective  Elizabeth Wolf is a 24 y.o. G2P1001 at [redacted]w[redacted]d being seen today for ongoing prenatal care.  She is currently monitored for the following issues for this low-risk pregnancy and has Rubella non-immune status, antepartum; Maternal varicella, non-immune; Atypical pigmented skin lesion; Birth control counseling; Insomnia; Depression with anxiety; Body mass index (BMI) of 35.0-35.9 in adult; Freckled skin; Inflamed skin tag; Supervision of normal pregnancy; and Obesity affecting pregnancy in first trimester on their problem list.  ----------------------------------------------------------------------------------- Patient reports no complaints.    . Vag. Bleeding: None.   . Leaking Fluid denies.  ----------------------------------------------------------------------------------- The following portions of the patient's history were reviewed and updated as appropriate: allergies, current medications, past family history, past medical history, past social history, past surgical history and problem list. Problem list updated.  Objective  Blood pressure 112/68, weight 183 lb (83 kg), last menstrual period 04/30/2020. Pregravid weight 182 lb (82.6 kg) Total Weight Gain 1 lb (0.454 kg) Urinalysis: Urine Protein    Urine Glucose    Fetal Status: Fetal Heart Rate (bpm): 176          Dating scan: [redacted]w[redacted]d by u/s, no adjustment of EDD for 3 day difference from exact LMP  General:  Alert, oriented and cooperative. Patient is in no acute distress.  Skin: Skin is warm and dry. No rash noted.   Cardiovascular: Normal heart rate noted  Respiratory: Normal respiratory effort, no problems with respiration noted  Abdomen: Soft, gravid, appropriate for gestational age.       Pelvic:  Cervical exam deferred        Extremities: Normal range of motion.     Mental Status: Normal mood and affect. Normal behavior. Normal judgment and thought content.   Assessment   24 y.o.  G2P1001 at [redacted]w[redacted]d by  02/04/2021, by Last Menstrual Period presenting for routine prenatal visit  Plan   pregnancy 2 Problems (from 06/26/20 to present)    Problem Noted Resolved   Supervision of normal pregnancy 06/26/2020 by Tresea Mall, CNM No   Overview Addendum 07/03/2020  4:45 PM by Tresea Mall, CNM    [facility] Prenatal Labs  Dating EDD by LMP c/w 8w u/s Blood type:     Genetic Screen 1 Screen:    AFP:     Quad:     NIPS: Antibody:   Anatomic [place]  Rubella:    Varicella: @VZVIGG @  GTT Early:                Third trimester:  RPR:     Rhogam  HBsAg:     Vaccines TDAP:                       Flu Shot: Covid: HIV:     Baby Food                                GBS:   GC/CT:  Contraception  Pap: 06/26/20  CBB     CS/VBAC    Support Person 14/7/21          Previous Version       Preterm labor symptoms and general obstetric precautions including but not limited to vaginal bleeding, contractions, leaking of fluid and fetal movement were reviewed in detail with the patient.   Return for lab only visit in 1 week (MaT 21) and rob in 4 weeks.  [name], CNM  07/03/2020 4:55 PM

## 2020-07-03 NOTE — Progress Notes (Signed)
ROB - dating scan. RM 5

## 2020-07-09 ENCOUNTER — Other Ambulatory Visit: Payer: Self-pay | Admitting: Advanced Practice Midwife

## 2020-07-09 ENCOUNTER — Other Ambulatory Visit: Payer: 59

## 2020-07-09 ENCOUNTER — Other Ambulatory Visit: Payer: Self-pay

## 2020-07-09 DIAGNOSIS — Z1379 Encounter for other screening for genetic and chromosomal anomalies: Secondary | ICD-10-CM

## 2020-07-09 DIAGNOSIS — Z3A10 10 weeks gestation of pregnancy: Secondary | ICD-10-CM

## 2020-07-09 DIAGNOSIS — Z3481 Encounter for supervision of other normal pregnancy, first trimester: Secondary | ICD-10-CM

## 2020-07-09 NOTE — Progress Notes (Signed)
MaterniT 21 order placed.

## 2020-07-11 ENCOUNTER — Other Ambulatory Visit: Payer: 59

## 2020-07-18 ENCOUNTER — Telehealth: Payer: Self-pay | Admitting: Advanced Practice Midwife

## 2020-07-18 NOTE — Telephone Encounter (Signed)
Patient is calling to see if her MAT 21 results are back. Please advise

## 2020-07-18 NOTE — Telephone Encounter (Signed)
Please let her know that results are not back. She will be able to see them in her MyChart if they are back. Also I will call or message her when they come back.

## 2020-07-19 NOTE — Telephone Encounter (Signed)
Patient aware.

## 2020-07-21 NOTE — L&D Delivery Note (Signed)
Delivery Note At 9:12 PM a viable female was delivered via Vaginal, Spontaneous (Presentation: Left Occiput Anterior).  APGAR: 8, 9; weight pending.   Placenta status: Spontaneous, Intact.  Cord: 3 vessels with the following complications: None.  Cord pH: n/a  Anesthesia: Epidural Episiotomy: None Lacerations: None Suture Repair:  n/a Est. Blood Loss (mL): 200  Mom to postpartum.  Baby to Couplet care / Skin to Skin.  Called to see patient.  Mom pushed to deliver a viable female infant.  The head followed by shoulders, which delivered without difficulty, and the rest of the body.  No nuchal cord noted.  Baby to mom's chest.  Cord clamped and cut after > 1 min delay.  No cord blood obtained.  Placenta delivered spontaneously, intact, with a 3-vessel cord.  No vaginal, cervical, or perineal lacerations. All counts correct.  Hemostasis obtained with IV pitocin and fundal massage. EBL 200 mL.     Thomasene Mohair 02/07/2021, 9:30 PM

## 2020-07-31 ENCOUNTER — Encounter: Payer: 59 | Admitting: Obstetrics and Gynecology

## 2020-08-02 ENCOUNTER — Ambulatory Visit (INDEPENDENT_AMBULATORY_CARE_PROVIDER_SITE_OTHER): Payer: 59 | Admitting: Obstetrics and Gynecology

## 2020-08-02 ENCOUNTER — Encounter: Payer: Self-pay | Admitting: Obstetrics and Gynecology

## 2020-08-02 ENCOUNTER — Other Ambulatory Visit: Payer: Self-pay

## 2020-08-02 VITALS — BP 115/75 | Wt 181.0 lb

## 2020-08-02 DIAGNOSIS — O09899 Supervision of other high risk pregnancies, unspecified trimester: Secondary | ICD-10-CM

## 2020-08-02 DIAGNOSIS — O99211 Obesity complicating pregnancy, first trimester: Secondary | ICD-10-CM

## 2020-08-02 DIAGNOSIS — Z3A13 13 weeks gestation of pregnancy: Secondary | ICD-10-CM

## 2020-08-02 DIAGNOSIS — Z283 Underimmunization status: Secondary | ICD-10-CM

## 2020-08-02 DIAGNOSIS — L918 Other hypertrophic disorders of the skin: Secondary | ICD-10-CM | POA: Diagnosis not present

## 2020-08-02 DIAGNOSIS — O0990 Supervision of high risk pregnancy, unspecified, unspecified trimester: Secondary | ICD-10-CM

## 2020-08-02 DIAGNOSIS — Z3481 Encounter for supervision of other normal pregnancy, first trimester: Secondary | ICD-10-CM

## 2020-08-02 DIAGNOSIS — O99891 Other specified diseases and conditions complicating pregnancy: Secondary | ICD-10-CM

## 2020-08-02 LAB — POCT URINALYSIS DIPSTICK OB
Glucose, UA: NEGATIVE
POC,PROTEIN,UA: NEGATIVE

## 2020-08-02 NOTE — Patient Instructions (Signed)
For nausea (these may be purchased over-the-counter): -Vitamin B6 (pyridoxine):  25 mg three times each day (may buy 100 mg tablet and take twice per day or try to cut into 4 equal pieces and take 1 piece three times each day).  - doxylamine (found in Unisom and other sleep agents that can be bought in the store): take 25 - 50 mg at bedtime.  May take up to 25 mg three time each day.  However, keep in mind that this might make you sleepy.  

## 2020-08-02 NOTE — Progress Notes (Signed)
Routine Prenatal Care Visit  Subjective  Elizabeth Wolf is a 25 y.o. G2P1001 at [redacted]w[redacted]d being seen today for ongoing prenatal care.  She is currently monitored for the following issues for this high-risk pregnancy and has Rubella non-immune status, antepartum; Maternal varicella, non-immune; Atypical pigmented skin lesion; Birth control counseling; Insomnia; Depression with anxiety; Body mass index (BMI) of 35.0-35.9 in adult; Freckled skin; Inflamed skin tag; Supervision of high risk pregnancy, antepartum; and Obesity affecting pregnancy in first trimester on their problem list.  ----------------------------------------------------------------------------------- Patient reports Continued Nausea. Not taking Zofran (afraid of side effects). Advised on OTC Unisom&B6..    Elizabeth Wolf. Bleeding: None.  Movement: Absent. Leaking Fluid denies.  ----------------------------------------------------------------------------------- The following portions of the patient's history were reviewed and updated as appropriate: allergies, current medications, past family history, past medical history, past social history, past surgical history and problem list. Problem list updated.  Objective  Blood pressure 115/75, weight 181 lb (82.1 kg), last menstrual period 04/30/2020. Pregravid weight 182 lb (82.6 kg) Total Weight Gain -1 lb (-0.454 kg) Urinalysis: Urine Protein Negative  Urine Glucose Negative  Fetal Status: Fetal Heart Rate (bpm): 160   Movement: Absent     General:  Alert, oriented and cooperative. Patient is in no acute distress.  Skin: Skin is warm and dry. No rash noted.   Cardiovascular: Normal heart rate noted  Respiratory: Normal respiratory effort, no problems with respiration noted  Abdomen: Soft, gravid, appropriate for gestational age. Pain/Pressure: Absent     Pelvic:  Cervical exam deferred        Extremities: Normal range of motion.     Mental Status: Normal mood and affect. Normal  behavior. Normal judgment and thought content.   Assessment   25 y.o. G2P1001 at [redacted]w[redacted]d by  02/04/2021, by Last Menstrual Period presenting for routine prenatal visit  Plan   pregnancy 2 Problems (from 06/26/20 to present)    Problem Noted Resolved   Supervision of high risk pregnancy, antepartum 06/26/2020 by Elizabeth Wolf, CNM No   Overview Addendum 07/03/2020  4:45 PM by Elizabeth Wolf, CNM    [facility]inic [facility] Prenatal Labs  Dating EDD by LMP c/w 8w u/s Blood type:     Genetic Screen 1 Screen:    AFP:     Quad:     NIPS: Antibody:   Anatomic [place]  Rubella:    Varicella: @VZVIGG @  GTT Early:                Third trimester:  RPR:     Rhogam  HBsAg:     Vaccines TDAP:                       Flu Shot: Covid: HIV:     Baby Food                                GBS:   GC/CT:  Contraception  Pap: 06/26/20  CBB     CS/VBAC    Support Person 14/7/21          Previous Version       Preterm labor symptoms and general obstetric precautions including but not limited to vaginal bleeding, contractions, leaking of fluid and fetal movement were reviewed in detail with the patient. Please refer to After Visit Summary for other counseling recommendations.   - discussed genetic screening for trisomies (21, 17, 43). Declines for now.  Return in about 5 weeks (around 09/06/2020) for anatomy u/s, 1h gtt, routine prenatal with Dr. Jean Wolf (if she prefers).   Elizabeth Mohair, MD, [facility], [facility] 08/02/2020 4:19 PM

## 2020-08-31 ENCOUNTER — Ambulatory Visit (INDEPENDENT_AMBULATORY_CARE_PROVIDER_SITE_OTHER): Payer: 59

## 2020-08-31 ENCOUNTER — Ambulatory Visit (INDEPENDENT_AMBULATORY_CARE_PROVIDER_SITE_OTHER): Payer: 59 | Admitting: Obstetrics and Gynecology

## 2020-08-31 ENCOUNTER — Other Ambulatory Visit: Payer: 59

## 2020-08-31 ENCOUNTER — Other Ambulatory Visit: Payer: Self-pay

## 2020-08-31 ENCOUNTER — Encounter: Payer: Self-pay | Admitting: Obstetrics and Gynecology

## 2020-08-31 VITALS — BP 116/80 | Wt 178.0 lb

## 2020-08-31 DIAGNOSIS — O0990 Supervision of high risk pregnancy, unspecified, unspecified trimester: Secondary | ICD-10-CM | POA: Diagnosis not present

## 2020-08-31 DIAGNOSIS — Z3A17 17 weeks gestation of pregnancy: Secondary | ICD-10-CM

## 2020-08-31 DIAGNOSIS — O99212 Obesity complicating pregnancy, second trimester: Secondary | ICD-10-CM

## 2020-08-31 DIAGNOSIS — O09899 Supervision of other high risk pregnancies, unspecified trimester: Secondary | ICD-10-CM

## 2020-08-31 DIAGNOSIS — Z283 Underimmunization status: Secondary | ICD-10-CM

## 2020-08-31 DIAGNOSIS — O99211 Obesity complicating pregnancy, first trimester: Secondary | ICD-10-CM | POA: Diagnosis not present

## 2020-08-31 DIAGNOSIS — Z2839 Other underimmunization status: Secondary | ICD-10-CM

## 2020-08-31 DIAGNOSIS — O0992 Supervision of high risk pregnancy, unspecified, second trimester: Secondary | ICD-10-CM

## 2020-08-31 DIAGNOSIS — O99891 Other specified diseases and conditions complicating pregnancy: Secondary | ICD-10-CM

## 2020-08-31 NOTE — Progress Notes (Signed)
Routine Prenatal Care Visit  Subjective  Elizabeth Wolf is a 25 y.o. G2P1001 at [redacted]w[redacted]d being seen today for ongoing prenatal care.  She is currently monitored for the following issues for this low-risk pregnancy and has Rubella non-immune status, antepartum; Maternal varicella, non-immune; Atypical pigmented skin lesion; Birth control counseling; Insomnia; Depression with anxiety; Body mass index (BMI) of 35.0-35.9 in adult; Freckled skin; Inflamed skin tag; Supervision of high risk pregnancy, antepartum; and Obesity affecting pregnancy in first trimester on their problem list.  ----------------------------------------------------------------------------------- Patient reports no complaints.    . Vag. Bleeding: None.  Movement: Present. Leaking Fluid denies.  Anatomy u/s incomplete for 3VV.  ----------------------------------------------------------------------------------- The following portions of the patient's history were reviewed and updated as appropriate: allergies, current medications, past family history, past medical history, past social history, past surgical history and problem list. Problem list updated.  Objective  Blood pressure 116/80, weight 178 lb (80.7 kg), last menstrual period 04/30/2020. Pregravid weight 182 lb (82.6 kg) Total Weight Gain -4 lb (-1.814 kg) Urinalysis: Urine Protein    Urine Glucose    Fetal Status: Fetal Heart Rate (bpm): nml ([place])   Movement: Present     General:  Alert, oriented and cooperative. Patient is in no acute distress.  Skin: Skin is warm and dry. No rash noted.   Cardiovascular: Normal heart rate noted  Respiratory: Normal respiratory effort, no problems with respiration noted  Abdomen: Soft, gravid, appropriate for gestational age. Pain/Pressure: Absent     Pelvic:  Cervical exam deferred        Extremities: Normal range of motion.     Mental Status: Normal mood and affect. Normal behavior. Normal judgment and thought content.    Imaging Results US OB Comp + 14 Wk  Result Date: 08/31/2020 Patient Name: Elizabeth Wolf DOB: 04-Aug-1995 MRN: [id number] ULTRASOUND REPORT Location: [facility] Date of Service: 08/31/2020 Indications:Anatomy Ultrasound Findings: Mason Jim intrauterine pregnancy is visualized with FHR at 144 BPM. Biometrics give an (U/S) Gestational age of [redacted]w[redacted]d and an (U/S) EDD of 02/07/2021; this correlates with the clinically established Estimated Date of Delivery: 02/04/21 Fetal presentation is Breech. EFW: 176 g ( 6 oz ). Placenta: anterior. Grade: 0 AFI: subjectively normal. Anatomic survey is incomplete for 3VV and normal; Gender - female.  Impression: 1. [redacted]w[redacted]d Viable Singleton Intrauterine pregnancy by U/S. 2. (U/S) EDD is consistent with Clinically established Estimated Date of Delivery: 02/04/21, 17w4 . 3. Incomplete anatomy scan. Recommendations: 1.Clinical correlation with the patient's History and Physical Exam. 2. Follow up ultrasound for 3VV in 2-4 weeks. Deanna Artis, RT There is a singleton gestation with subjectively normal amniotic fluid volume. The fetal biometry correlates with established dating. Detailed evaluation of the fetal anatomy was performed.The fetal anatomical survey appears within normal limits within the resolution of ultrasound as described above.  Not all structures were able to be visualized on today's study.  It is recommended that a follow-up ultrasound be performed to complete visualization of the unobserved structures today.  It must be noted that a normal ultrasound is unable to rule out fetal aneuploidy nor is it able to detect all possible malformations.   The ultrasound images and findings were reviewed by me and I agree with the above report. Thomasene Mohair, MD, [facility] 08/31/2020 4:45 PM       Assessment   25 y.o. G2P1001 at [redacted]w[redacted]d by  02/04/2021, by Last Menstrual Period presenting for routine prenatal visit  Plan   pregnancy  2 Problems (from 06/26/20 to present)    Problem Noted Resolved   Supervision of high risk pregnancy, antepartum 06/26/2020 by Tresea Mall, CNM No   Overview Addendum 07/03/2020  4:45 PM by Tresea Mall, CNM    [facility]inic [facility] Prenatal Labs  Dating EDD by LMP c/w 8w u/s Blood type:     Genetic Screen 1 Screen:    AFP:     Quad:     NIPS: Antibody:   Anatomic [place]  Rubella:    Varicella: @VZVIGG @  GTT Early:                Third trimester:  RPR:     Rhogam  HBsAg:     Vaccines TDAP:                       Flu Shot: Covid: HIV:     Baby Food                                GBS:   GC/CT:  Contraception  Pap: 06/26/20  CBB     CS/VBAC    Support Person 14/7/21          Previous Version       Preterm labor symptoms and general obstetric precautions including but not limited to vaginal bleeding, contractions, leaking of fluid and fetal movement were reviewed in detail with the patient. Please refer to After Visit Summary for other counseling recommendations.   Return in about 2 weeks (around 09/14/2020) for Completion anatomy u/s and routine prenatal.   09/16/2020, MD, [facility], [facility] 08/31/2020 4:59 PM

## 2020-09-01 LAB — GLUCOSE, 1 HOUR GESTATIONAL: Gestational Diabetes Screen: 134 mg/dL (ref 65–139)

## 2020-09-12 ENCOUNTER — Other Ambulatory Visit: Payer: Self-pay | Admitting: Obstetrics and Gynecology

## 2020-09-12 DIAGNOSIS — Z362 Encounter for other antenatal screening follow-up: Secondary | ICD-10-CM

## 2020-09-14 ENCOUNTER — Encounter: Payer: 59 | Admitting: Obstetrics and Gynecology

## 2020-09-14 ENCOUNTER — Other Ambulatory Visit: Payer: 59

## 2020-09-18 ENCOUNTER — Ambulatory Visit (INDEPENDENT_AMBULATORY_CARE_PROVIDER_SITE_OTHER): Payer: 59

## 2020-09-18 ENCOUNTER — Other Ambulatory Visit: Payer: Self-pay

## 2020-09-18 ENCOUNTER — Encounter: Payer: Self-pay | Admitting: Obstetrics and Gynecology

## 2020-09-18 ENCOUNTER — Ambulatory Visit (INDEPENDENT_AMBULATORY_CARE_PROVIDER_SITE_OTHER): Payer: 59 | Admitting: Obstetrics and Gynecology

## 2020-09-18 VITALS — BP 118/74 | Wt 183.0 lb

## 2020-09-18 DIAGNOSIS — Z362 Encounter for other antenatal screening follow-up: Secondary | ICD-10-CM

## 2020-09-18 DIAGNOSIS — Z3A20 20 weeks gestation of pregnancy: Secondary | ICD-10-CM

## 2020-09-18 DIAGNOSIS — O99211 Obesity complicating pregnancy, first trimester: Secondary | ICD-10-CM

## 2020-09-18 DIAGNOSIS — O0990 Supervision of high risk pregnancy, unspecified, unspecified trimester: Secondary | ICD-10-CM

## 2020-09-18 NOTE — Progress Notes (Signed)
Routine Prenatal Care Visit  Subjective  Elizabeth Wolf is a 25 y.o. G2P1001 at [redacted]w[redacted]d being seen today for ongoing prenatal care.  She is currently monitored for the following issues for this low-risk pregnancy and has Rubella non-immune status, antepartum; Maternal varicella, non-immune; Atypical pigmented skin lesion; Birth control counseling; Insomnia; Depression with anxiety; Body mass index (BMI) of 35.0-35.9 in adult; Freckled skin; Inflamed skin tag; Supervision of high risk pregnancy, antepartum; and Obesity affecting pregnancy in first trimester on their problem list.  ----------------------------------------------------------------------------------- Patient reports no complaints.    . Vag. Bleeding: None.  Movement: Absent. Leaking Fluid denies.  Anatomy u/s complete ----------------------------------------------------------------------------------- The following portions of the patient's history were reviewed and updated as appropriate: allergies, current medications, past family history, past medical history, past social history, past surgical history and problem list. Problem list updated.  Objective  Blood pressure 118/74, weight 183 lb (83 kg), last menstrual period 04/30/2020. Pregravid weight 182 lb (82.6 kg) Total Weight Gain 1 lb (0.454 kg) Urinalysis: Urine Protein    Urine Glucose    Fetal Status: Fetal Heart Rate (bpm): 144 ([place])   Movement: Absent     General:  Alert, oriented and cooperative. Patient is in no acute distress.  Skin: Skin is warm and dry. No rash noted.   Cardiovascular: Normal heart rate noted  Respiratory: Normal respiratory effort, no problems with respiration noted  Abdomen: Soft, gravid, appropriate for gestational age. Pain/Pressure: Absent     Pelvic:  Cervical exam deferred        Extremities: Normal range of motion.     Mental Status: Normal mood and affect. Normal behavior. Normal judgment and thought content.   Imaging Results US OB  Follow Up  Result Date: 09/18/2020 Patient Name: Elizabeth Wolf DOB: 1995-07-26 MRN: [id number] ULTRASOUND REPORT Location: [facility] Date of Service: 09/18/2020 Indications: Anatomy follow up ultrasound Findings: Mason Jim intrauterine pregnancy is visualized with FHR at 148 BPM. Fetal presentation is Cephalic. Placenta: anterior. Grade: 1 AFI: subjectively normal. Anatomic survey is complete for 3VV There is no free peritoneal fluid in the cul de sac. Impression: 1. [redacted]w[redacted]d Viable Singleton Intrauterine pregnancy previously established criteria. 2. Normal Anatomy Scan is now complete Darlina Guys, RT There is a singleton gestation with subjectively normal amniotic fluid volume. Detailed evaluation of the fetal anatomy was performed. The fetal anatomical survey appears within normal limits within the resolution of ultrasound as described above.  Not all structures were attempted to be visualized as they were noted on a previous exam. See the prior exam for full details.  It must be noted that a normal ultrasound is unable to rule out fetal aneuploidy nor is it able to detect all possible malformations. The ultrasound images and findings were reviewed by me and I agree with the above report. Thomasene Mohair, MD, [facility], [facility] 09/18/2020 4:43 PM       Assessment   25 y.o. G2P1001 at [redacted]w[redacted]d by  02/04/2021, by Last Menstrual Period presenting for routine prenatal visit  Plan   pregnancy 2 Problems (from 06/26/20 to present)    Problem Noted Resolved   Supervision of high risk pregnancy, antepartum 06/26/2020 by Tresea Mall, CNM No   Overview Addendum 07/03/2020  4:45 PM by Tresea Mall, CNM    [facility]inic [facility] Prenatal Labs  Dating EDD by LMP c/w 8w u/s Blood type:     Genetic Screen 1 Screen:    AFP:     Quad:  NIPS: Antibody:   Anatomic [place]  Rubella:    Varicella: @VZVIGG @  GTT Early:                Third trimester:  RPR:     Rhogam  HBsAg:     Vaccines  TDAP:                       Flu Shot: Covid: HIV:     Baby Food                                GBS:   GC/CT:  Contraception  Pap: 06/26/20  CBB     CS/VBAC    Support Person 14/7/21        Previous Version       Preterm labor symptoms and general obstetric precautions including but not limited to vaginal bleeding, contractions, leaking of fluid and fetal movement were reviewed in detail with the patient. Please refer to After Visit Summary for other counseling recommendations.   Return in about 4 weeks (around 10/16/2020) for Routine Prenatal Appointment.   10/18/2020, MD, [facility] Mohair [facility], [facility] 09/18/2020 4:51 PM

## 2020-10-16 ENCOUNTER — Ambulatory Visit (INDEPENDENT_AMBULATORY_CARE_PROVIDER_SITE_OTHER): Payer: 59 | Admitting: Obstetrics and Gynecology

## 2020-10-16 ENCOUNTER — Other Ambulatory Visit: Payer: Self-pay

## 2020-10-16 ENCOUNTER — Encounter: Payer: Self-pay | Admitting: Obstetrics and Gynecology

## 2020-10-16 VITALS — BP 122/74 | Wt 184.0 lb

## 2020-10-16 DIAGNOSIS — Z113 Encounter for screening for infections with a predominantly sexual mode of transmission: Secondary | ICD-10-CM

## 2020-10-16 DIAGNOSIS — O99212 Obesity complicating pregnancy, second trimester: Secondary | ICD-10-CM

## 2020-10-16 DIAGNOSIS — Z131 Encounter for screening for diabetes mellitus: Secondary | ICD-10-CM

## 2020-10-16 DIAGNOSIS — Z283 Underimmunization status: Secondary | ICD-10-CM

## 2020-10-16 DIAGNOSIS — Z2839 Other underimmunization status: Secondary | ICD-10-CM

## 2020-10-16 DIAGNOSIS — Z3A24 24 weeks gestation of pregnancy: Secondary | ICD-10-CM

## 2020-10-16 DIAGNOSIS — O09899 Supervision of other high risk pregnancies, unspecified trimester: Secondary | ICD-10-CM

## 2020-10-16 DIAGNOSIS — O0992 Supervision of high risk pregnancy, unspecified, second trimester: Secondary | ICD-10-CM

## 2020-10-16 DIAGNOSIS — O99891 Other specified diseases and conditions complicating pregnancy: Secondary | ICD-10-CM

## 2020-10-16 NOTE — Progress Notes (Signed)
Routine Prenatal Care Visit  Subjective  Elizabeth Wolf is a 25 y.o. G2P1001 at [redacted]w[redacted]d being seen today for ongoing prenatal care.  She is currently monitored for the following issues for this low-risk pregnancy and has Rubella non-immune status, antepartum; Maternal varicella, non-immune; Atypical pigmented skin lesion; Birth control counseling; Insomnia; Depression with anxiety; Body mass index (BMI) of 35.0-35.9 in adult; Freckled skin; Inflamed skin tag; Supervision of high risk pregnancy, antepartum; and Obesity affecting pregnancy in first trimester on their problem list.  ----------------------------------------------------------------------------------- Patient reports no complaints.    . Vag. Bleeding: None.  Movement: Present. Leaking Fluid denies.  ----------------------------------------------------------------------------------- The following portions of the patient's history were reviewed and updated as appropriate: allergies, current medications, past family history, past medical history, past social history, past surgical history and problem list. Problem list updated.  Objective  Blood pressure 122/74, weight 184 lb (83.5 kg), last menstrual period 04/30/2020. Pregravid weight 182 lb (82.6 kg) Total Weight Gain 2 lb (0.907 kg) Urinalysis: Urine Protein    Urine Glucose    Fetal Status: Fetal Heart Rate (bpm): 140 Fundal Height: 24 cm Movement: Present     General:  Alert, oriented and cooperative. Patient is in no acute distress.  Skin: Skin is warm and dry. No rash noted.   Cardiovascular: Normal heart rate noted  Respiratory: Normal respiratory effort, no problems with respiration noted  Abdomen: Soft, gravid, appropriate for gestational age. Pain/Pressure: Absent     Pelvic:  Cervical exam deferred        Extremities: Normal range of motion.     Mental Status: Normal mood and affect. Normal behavior. Normal judgment and thought content.   Assessment   25 y.o.  G2P1001 at [redacted]w[redacted]d by  02/04/2021, by Last Menstrual Period presenting for routine prenatal visit  Plan   pregnancy 2 Problems (from 06/26/20 to present)    Problem Noted Resolved   Supervision of high risk pregnancy, antepartum 06/26/2020 by Tresea Mall, CNM No   Overview Addendum 07/03/2020  4:45 PM by Tresea Mall, CNM    [facility]inic [facility] Prenatal Labs  Dating EDD by LMP c/w 8w u/s Blood type:     Genetic Screen 1 Screen:    AFP:     Quad:     NIPS: Antibody:   Anatomic [place]  Rubella:    Varicella: @VZVIGG @  GTT Early:                Third trimester:  RPR:     Rhogam  HBsAg:     Vaccines TDAP:                       Flu Shot: Covid: HIV:     Baby Food                                GBS:   GC/CT:  Contraception  Pap: 06/26/20  CBB     CS/VBAC    Support Person 14/7/21          Previous Version       Preterm labor symptoms and general obstetric precautions including but not limited to vaginal bleeding, contractions, leaking of fluid and fetal movement were reviewed in detail with the patient. Please refer to After Visit Summary for other counseling recommendations.   - 28 wk labs next visit  Return in about 4 weeks (around 11/13/2020) for 28 week labs and routine prenatal (  another ROB in 6 weeks with Dr. Jean Rosenthal).   Thomasene Mohair, MD, [facility], [facility] 10/16/2020 4:33 PM

## 2020-11-13 ENCOUNTER — Ambulatory Visit (INDEPENDENT_AMBULATORY_CARE_PROVIDER_SITE_OTHER): Payer: 59 | Admitting: Obstetrics and Gynecology

## 2020-11-13 ENCOUNTER — Other Ambulatory Visit: Payer: 59

## 2020-11-13 ENCOUNTER — Other Ambulatory Visit: Payer: Self-pay

## 2020-11-13 ENCOUNTER — Encounter: Payer: Self-pay | Admitting: Obstetrics and Gynecology

## 2020-11-13 VITALS — BP 120/76 | Wt 185.0 lb

## 2020-11-13 DIAGNOSIS — Z113 Encounter for screening for infections with a predominantly sexual mode of transmission: Secondary | ICD-10-CM

## 2020-11-13 DIAGNOSIS — Z131 Encounter for screening for diabetes mellitus: Secondary | ICD-10-CM

## 2020-11-13 DIAGNOSIS — O99212 Obesity complicating pregnancy, second trimester: Secondary | ICD-10-CM | POA: Diagnosis not present

## 2020-11-13 DIAGNOSIS — O0992 Supervision of high risk pregnancy, unspecified, second trimester: Secondary | ICD-10-CM | POA: Diagnosis not present

## 2020-11-13 DIAGNOSIS — O0993 Supervision of high risk pregnancy, unspecified, third trimester: Secondary | ICD-10-CM

## 2020-11-13 DIAGNOSIS — O99213 Obesity complicating pregnancy, third trimester: Secondary | ICD-10-CM

## 2020-11-13 DIAGNOSIS — Z3A28 28 weeks gestation of pregnancy: Secondary | ICD-10-CM

## 2020-11-13 NOTE — Progress Notes (Signed)
Routine Prenatal Care Visit  Subjective  Elizabeth Wolf is a 25 y.o. G2P1001 at [redacted]w[redacted]d being seen today for ongoing prenatal care.  She is currently monitored for the following issues for this low-risk pregnancy and has Rubella non-immune status, antepartum; Maternal varicella, non-immune; Atypical pigmented skin lesion; Birth control counseling; Insomnia; Depression with anxiety; Body mass index (BMI) of 35.0-35.9 in adult; Freckled skin; Inflamed skin tag; Supervision of high risk pregnancy, antepartum; and Obesity affecting pregnancy in first trimester on their problem list.  ----------------------------------------------------------------------------------- Patient reports no complaints.   Contractions: Not present. Vag. Bleeding: None.  Movement: Present. Leaking Fluid denies.  ----------------------------------------------------------------------------------- The following portions of the patient's history were reviewed and updated as appropriate: allergies, current medications, past family history, past medical history, past social history, past surgical history and problem list. Problem list updated.  Objective  Blood pressure 120/76, weight 185 lb (83.9 kg), last menstrual period 04/30/2020. Pregravid weight 182 lb (82.6 kg) Total Weight Gain 3 lb (1.361 kg) Urinalysis: Urine Protein    Urine Glucose    Fetal Status: Fetal Heart Rate (bpm): 140 Fundal Height: 29 cm Movement: Present     General:  Alert, oriented and cooperative. Patient is in no acute distress.  Skin: Skin is warm and dry. No rash noted.   Cardiovascular: Normal heart rate noted  Respiratory: Normal respiratory effort, no problems with respiration noted  Abdomen: Soft, gravid, appropriate for gestational age. Pain/Pressure: Absent     Pelvic:  Cervical exam deferred        Extremities: Normal range of motion.  Edema: None  Mental Status: Normal mood and affect. Normal behavior. Normal judgment and thought  content.   Assessment   25 y.o. G2P1001 at [redacted]w[redacted]d by  02/04/2021, by Last Menstrual Period presenting for routine prenatal visit  Plan   pregnancy 2 Problems (from 06/26/20 to present)    Problem Noted Resolved   Supervision of high risk pregnancy, antepartum 06/26/2020 by Tresea Mall, CNM No   Overview Addendum 07/03/2020  4:45 PM by Tresea Mall, CNM    [facility]inic [facility] Prenatal Labs  Dating EDD by LMP c/w 8w u/s Blood type:     Genetic Screen 1 Screen:    AFP:     Quad:     NIPS: Antibody:   Anatomic [place]  Rubella:    Varicella: @VZVIGG @  GTT Early:                Third trimester:  RPR:     Rhogam  HBsAg:     Vaccines TDAP:                       Flu Shot: Covid: HIV:     Baby Food                                GBS:   GC/CT:  Contraception  Pap: 06/26/20  CBB     CS/VBAC    Support Person 14/7/21          Previous Version       Preterm labor symptoms and general obstetric precautions including but not limited to vaginal bleeding, contractions, leaking of fluid and fetal movement were reviewed in detail with the patient. Please refer to After Visit Summary for other counseling recommendations.   - 28 week labs today (RH +)  Return in about 2 weeks (around 11/27/2020) for Routine Prenatal Appointment.  Thomasene Mohair, MD, [facility], [facility] 11/13/2020 4:39 PM

## 2020-11-13 NOTE — Telephone Encounter (Signed)
Please schedule

## 2020-11-14 LAB — 28 WEEK RH+PANEL
Basophils Absolute: 0 10*3/uL (ref 0.0–0.2)
Basos: 0 %
EOS (ABSOLUTE): 0.1 10*3/uL (ref 0.0–0.4)
Eos: 1 %
Gestational Diabetes Screen: 137 mg/dL (ref 65–139)
HIV Screen 4th Generation wRfx: NONREACTIVE
Hematocrit: 35.5 % (ref 34.0–46.6)
Hemoglobin: 11.5 g/dL (ref 11.1–15.9)
Immature Grans (Abs): 0 10*3/uL (ref 0.0–0.1)
Immature Granulocytes: 0 %
Lymphocytes Absolute: 2.1 10*3/uL (ref 0.7–3.1)
Lymphs: 25 %
MCH: 27.5 pg (ref 26.6–33.0)
MCHC: 32.4 g/dL (ref 31.5–35.7)
MCV: 85 fL (ref 79–97)
Monocytes Absolute: 0.4 10*3/uL (ref 0.1–0.9)
Monocytes: 5 %
Neutrophils Absolute: 5.5 10*3/uL (ref 1.4–7.0)
Neutrophils: 69 %
Platelets: 217 10*3/uL (ref 150–450)
RBC: 4.18 x10E6/uL (ref 3.77–5.28)
RDW: 14 % (ref 11.7–15.4)
RPR Ser Ql: NONREACTIVE
WBC: 8.1 10*3/uL (ref 3.4–10.8)

## 2020-11-14 NOTE — Telephone Encounter (Signed)
Please advise Dr. Jean Rosenthal

## 2020-11-14 NOTE — Telephone Encounter (Signed)
Patient is scheduled for next five appointment patient states she will wait to scheduled weekly appointment

## 2020-11-27 ENCOUNTER — Encounter: Payer: Self-pay | Admitting: Obstetrics and Gynecology

## 2020-11-27 ENCOUNTER — Ambulatory Visit (INDEPENDENT_AMBULATORY_CARE_PROVIDER_SITE_OTHER): Payer: 59 | Admitting: Obstetrics and Gynecology

## 2020-11-27 ENCOUNTER — Other Ambulatory Visit: Payer: Self-pay

## 2020-11-27 VITALS — BP 122/74 | Wt 188.0 lb

## 2020-11-27 DIAGNOSIS — Z23 Encounter for immunization: Secondary | ICD-10-CM

## 2020-11-27 DIAGNOSIS — Z3A30 30 weeks gestation of pregnancy: Secondary | ICD-10-CM

## 2020-11-27 DIAGNOSIS — O99213 Obesity complicating pregnancy, third trimester: Secondary | ICD-10-CM

## 2020-11-27 DIAGNOSIS — O09899 Supervision of other high risk pregnancies, unspecified trimester: Secondary | ICD-10-CM

## 2020-11-27 DIAGNOSIS — O0993 Supervision of high risk pregnancy, unspecified, third trimester: Secondary | ICD-10-CM

## 2020-11-27 DIAGNOSIS — Z2839 Other underimmunization status: Secondary | ICD-10-CM

## 2020-11-27 NOTE — Progress Notes (Signed)
Routine Prenatal Care Visit  Subjective  Elizabeth Wolf is a 25 y.o. G2P1001 at [redacted]w[redacted]d being seen today for ongoing prenatal care.  She is currently monitored for the following issues for this low-risk pregnancy and has Rubella non-immune status, antepartum; Maternal varicella, non-immune; Atypical pigmented skin lesion; Birth control counseling; Insomnia; Depression with anxiety; Body mass index (BMI) of 35.0-35.9 in adult; Freckled skin; Inflamed skin tag; Supervision of high risk pregnancy, antepartum; and Obesity affecting pregnancy in first trimester on their problem list.  ----------------------------------------------------------------------------------- Patient reports heartburn.   Contractions: Not present. Vag. Bleeding: None.  Movement: Present. Leaking Fluid denies.  ----------------------------------------------------------------------------------- The following portions of the patient's history were reviewed and updated as appropriate: allergies, current medications, past family history, past medical history, past social history, past surgical history and problem list. Problem list updated.  Objective  Blood pressure 122/74, weight 188 lb (85.3 kg), last menstrual period 04/30/2020. Pregravid weight 182 lb (82.6 kg) Total Weight Gain 6 lb (2.722 kg) Urinalysis: Urine Protein    Urine Glucose    Fetal Status: Fetal Heart Rate (bpm): 155 Fundal Height: 30 cm Movement: Present     General:  Alert, oriented and cooperative. Patient is in no acute distress.  Skin: Skin is warm and dry. No rash noted.   Cardiovascular: Normal heart rate noted  Respiratory: Normal respiratory effort, no problems with respiration noted  Abdomen: Soft, gravid, appropriate for gestational age. Pain/Pressure: Absent     Pelvic:  Cervical exam deferred        Extremities: Normal range of motion.  Edema: None  Mental Status: Normal mood and affect. Normal behavior. Normal judgment and thought content.    Assessment   25 y.o. G2P1001 at [redacted]w[redacted]d by  02/04/2021, by Last Menstrual Period presenting for routine prenatal visit  Plan   pregnancy 2 Problems (from 06/26/20 to present)    Problem Noted Resolved   Supervision of high risk pregnancy, antepartum 06/26/2020 by Tresea Mall, CNM No   Overview Addendum 11/27/2020  4:27 PM by Conard Novak, MD    Clinic [facility] Prenatal Labs  Dating EDD by LMP c/w 8w u/s Blood type: B/Positive/-- (12/07 1620)   Genetic Screen Did not get Antibody:Negative (12/07 1620)  Anatomic [place] complete Rubella: <0.90 (12/07 1620)  Varicella: non-immune  GTT Early: hgb A1c 5.0               Third trimester: 137 RPR: Non Reactive (04/26 1604)   Rhogam n/a HBsAg: Negative (12/07 1620)   Vaccines TDAP:  11/27/20                      Flu Shot: Covid: HIV: Non Reactive (04/26 1604)   Baby Food                                GBS:   GC/CT:  Contraception  Pap: 06/26/20  CBB     CS/VBAC    Support Person [name]          Previous Version       Preterm labor symptoms and general obstetric precautions including but not limited to vaginal bleeding, contractions, leaking of fluid and fetal movement were reviewed in detail with the patient. Please refer to After Visit Summary for other counseling recommendations.   - discussed 28 week lab results - PPI for heartburn (prilosec, nexium ,etc).  - TDaP today  Return in about 2  weeks (around 12/11/2020) for Routine Prenatal Appointment.   Thomasene Mohair, MD, [facility], [facility] 11/27/2020 4:45 PM

## 2020-12-13 ENCOUNTER — Encounter: Payer: 59 | Admitting: Obstetrics and Gynecology

## 2020-12-14 ENCOUNTER — Ambulatory Visit (INDEPENDENT_AMBULATORY_CARE_PROVIDER_SITE_OTHER): Payer: 59 | Admitting: Obstetrics and Gynecology

## 2020-12-14 ENCOUNTER — Other Ambulatory Visit: Payer: Self-pay

## 2020-12-14 ENCOUNTER — Encounter: Payer: Self-pay | Admitting: Obstetrics and Gynecology

## 2020-12-14 VITALS — BP 90/68 | Wt 187.0 lb

## 2020-12-14 DIAGNOSIS — O0993 Supervision of high risk pregnancy, unspecified, third trimester: Secondary | ICD-10-CM

## 2020-12-14 DIAGNOSIS — Z3A32 32 weeks gestation of pregnancy: Secondary | ICD-10-CM

## 2020-12-14 DIAGNOSIS — O99213 Obesity complicating pregnancy, third trimester: Secondary | ICD-10-CM

## 2020-12-14 DIAGNOSIS — Z2839 Other underimmunization status: Secondary | ICD-10-CM

## 2020-12-14 LAB — POCT URINALYSIS DIPSTICK OB: Glucose, UA: NEGATIVE

## 2020-12-14 NOTE — Progress Notes (Signed)
ROB- pretty bad heartburn lately

## 2020-12-27 ENCOUNTER — Encounter: Payer: 59 | Admitting: Obstetrics and Gynecology

## 2021-01-10 ENCOUNTER — Encounter: Payer: 59 | Admitting: Obstetrics and Gynecology

## 2021-01-11 ENCOUNTER — Telehealth: Payer: Self-pay

## 2021-01-11 NOTE — Telephone Encounter (Signed)
FMLA/DISABILITY form for [employer] filled out.  Will obtain signature, fax (after forms and fee) and process.

## 2021-01-15 ENCOUNTER — Ambulatory Visit (INDEPENDENT_AMBULATORY_CARE_PROVIDER_SITE_OTHER): Payer: 59 | Admitting: Obstetrics and Gynecology

## 2021-01-15 ENCOUNTER — Encounter: Payer: Self-pay | Admitting: Obstetrics and Gynecology

## 2021-01-15 ENCOUNTER — Other Ambulatory Visit: Payer: Self-pay

## 2021-01-15 VITALS — BP 122/82 | Wt 193.0 lb

## 2021-01-15 DIAGNOSIS — Z3A37 37 weeks gestation of pregnancy: Secondary | ICD-10-CM | POA: Diagnosis not present

## 2021-01-15 DIAGNOSIS — O0993 Supervision of high risk pregnancy, unspecified, third trimester: Secondary | ICD-10-CM | POA: Diagnosis not present

## 2021-01-15 DIAGNOSIS — O09899 Supervision of other high risk pregnancies, unspecified trimester: Secondary | ICD-10-CM

## 2021-01-15 DIAGNOSIS — O99213 Obesity complicating pregnancy, third trimester: Secondary | ICD-10-CM

## 2021-01-15 DIAGNOSIS — Z2839 Other underimmunization status: Secondary | ICD-10-CM

## 2021-01-15 LAB — POCT URINALYSIS DIPSTICK OB
Glucose, UA: NEGATIVE
POC,PROTEIN,UA: NEGATIVE

## 2021-01-15 NOTE — Progress Notes (Signed)
Routine Prenatal Care Visit  Subjective  Elizabeth Wolf is a 25 y.o. G2P1001 at [redacted]w[redacted]d being seen today for ongoing prenatal care.  She is currently monitored for the following issues for this low-risk pregnancy and has Rubella non-immune status, antepartum; Maternal varicella, non-immune; Atypical pigmented skin lesion; Birth control counseling; Insomnia; Depression with anxiety; Body mass index (BMI) of 35.0-35.9 in adult; Freckled skin; Inflamed skin tag; Supervision of high risk pregnancy, antepartum; and Obesity affecting pregnancy in first trimester on their problem list.  ----------------------------------------------------------------------------------- Patient reports no complaints.   Contractions: Irregular. Vag. Bleeding: None.  Movement: Present. Leaking Fluid denies.  ----------------------------------------------------------------------------------- The following portions of the patient's history were reviewed and updated as appropriate: allergies, current medications, past family history, past medical history, past social history, past surgical history and problem list. Problem list updated.  Objective  Blood pressure 122/82, weight 193 lb (87.5 kg), last menstrual period 04/30/2020. Pregravid weight 182 lb (82.6 kg) Total Weight Gain 11 lb (4.99 kg) Urinalysis: Urine Protein Negative  Urine Glucose Negative  Fetal Status: Fetal Heart Rate (bpm): 145 Fundal Height: 37 cm Movement: Present  Presentation: Vertex (by u/s)  General:  Alert, oriented and cooperative. Patient is in no acute distress.  Skin: Skin is warm and dry. No rash noted.   Cardiovascular: Normal heart rate noted  Respiratory: Normal respiratory effort, no problems with respiration noted  Abdomen: Soft, gravid, appropriate for gestational age. Pain/Pressure: Present     Pelvic:  Cervical exam deferred        Extremities: Normal range of motion.  Edema: None  Mental Status: Normal mood and affect. Normal  behavior. Normal judgment and thought content.   GBS collected with female chaperone present  Assessment   25 y.o. G2P1001 at [redacted]w[redacted]d by  02/04/2021, by Last Menstrual Period presenting for routine prenatal visit  Plan   pregnancy 2 Problems (from 06/26/20 to present)     Problem Noted Resolved   Supervision of high risk pregnancy, antepartum 06/26/2020 by Tresea Mall, CNM No   Overview Addendum 11/27/2020  4:27 PM by Conard Novak, MD    [facility]inic [facility] Prenatal Labs  Dating EDD by LMP c/w 8w u/s Blood type: B/Positive/-- (12/07 1620)   Genetic Screen Did not get Antibody:Negative (12/07 1620)  Anatomic [place] complete Rubella: <0.90 (12/07 1620)  Varicella: non-immune  GTT Early: hgb A1c 5.0               Third trimester: 137 RPR: Non Reactive (04/26 1604)   Rhogam n/a HBsAg: Negative (12/07 1620)   Vaccines TDAP:  11/27/20                      Flu Shot: Covid: HIV: Non Reactive (04/26 1604)   Baby Food                                GBS:   GC/CT:  Contraception  Pap: 06/26/20  CBB     CS/VBAC    Support Person [name]              Term labor symptoms and general obstetric precautions including but not limited to vaginal bleeding, contractions, leaking of fluid and fetal movement were reviewed in detail with the patient. Please refer to After Visit Summary for other counseling recommendations.   Return in about 1 week (around 01/22/2021) for Routine Prenatal Appointment.   Thomasene Mohair, MD, [facility]  OB/GYN, [facility] 01/15/2021 4:42 PM

## 2021-01-18 LAB — STREP GP B NAA: Strep Gp B NAA: NEGATIVE

## 2021-01-23 ENCOUNTER — Encounter: Payer: 59 | Admitting: Obstetrics and Gynecology

## 2021-01-24 ENCOUNTER — Other Ambulatory Visit: Payer: Self-pay

## 2021-01-24 ENCOUNTER — Ambulatory Visit (INDEPENDENT_AMBULATORY_CARE_PROVIDER_SITE_OTHER): Payer: 59 | Admitting: Advanced Practice Midwife

## 2021-01-24 ENCOUNTER — Encounter: Payer: Self-pay | Admitting: Advanced Practice Midwife

## 2021-01-24 VITALS — BP 118/76 | Wt 194.0 lb

## 2021-01-24 DIAGNOSIS — O99213 Obesity complicating pregnancy, third trimester: Secondary | ICD-10-CM

## 2021-01-24 DIAGNOSIS — Z3A38 38 weeks gestation of pregnancy: Secondary | ICD-10-CM

## 2021-01-24 DIAGNOSIS — O0993 Supervision of high risk pregnancy, unspecified, third trimester: Secondary | ICD-10-CM

## 2021-01-24 NOTE — Progress Notes (Signed)
  Routine Prenatal Care Visit  Subjective  Elizabeth Wolf is a 25 y.o. G2P1001 at [redacted]w[redacted]d being seen today for ongoing prenatal care.  She is currently monitored for the following issues for this high-risk pregnancy and has Rubella non-immune status, antepartum; Maternal varicella, non-immune; Atypical pigmented skin lesion; Birth control counseling; Insomnia; Depression with anxiety; Body mass index (BMI) of 35.0-35.9 in adult; Freckled skin; Inflamed skin tag; Supervision of high risk pregnancy, antepartum; and Obesity affecting pregnancy in first trimester on their problem list.  ----------------------------------------------------------------------------------- Patient reports no complaints.  She has an increase in contractions in the past week- mostly irregular. Contractions: Irregular. Vag. Bleeding: None.  Movement: Present. Leaking Fluid denies.  ----------------------------------------------------------------------------------- The following portions of the patient's history were reviewed and updated as appropriate: allergies, current medications, past family history, past medical history, past social history, past surgical history and problem list. Problem list updated.  Objective  Blood pressure 118/76, weight 194 lb (88 kg), last menstrual period 04/30/2020. Pregravid weight 182 lb (82.6 kg) Total Weight Gain 12 lb (5.443 kg) Urinalysis: Urine Protein    Urine Glucose    Fetal Status: Fetal Heart Rate (bpm): 148 Fundal Height: 38 cm Movement: Present  Presentation: Vertex  General:  Alert, oriented and cooperative. Patient is in no acute distress.  Skin: Skin is warm and dry. No rash noted.   Cardiovascular: Normal heart rate noted  Respiratory: Normal respiratory effort, no problems with respiration noted  Abdomen: Soft, gravid, appropriate for gestational age. Pain/Pressure: Present     Pelvic:  Cervical exam performed Dilation: 1.5 Effacement (%): 60 Station: -3  Extremities:  Normal range of motion.  Edema: None  Mental Status: Normal mood and affect. Normal behavior. Normal judgment and thought content.   Assessment   25 y.o. G2P1001 at [redacted]w[redacted]d by  02/04/2021, by Last Menstrual Period presenting for routine prenatal visit  Plan   pregnancy 2 Problems (from 06/26/20 to present)    Problem Noted Resolved   Supervision of high risk pregnancy, antepartum 06/26/2020 by Tresea Mall, CNM No   Overview Addendum 11/27/2020  4:27 PM by Conard Novak, MD    [facility]inic [facility] Prenatal Labs  Dating EDD by LMP c/w 8w u/s Blood type: B/Positive/-- (12/07 1620)   Genetic Screen Did not get Antibody:Negative (12/07 1620)  Anatomic [place] complete Rubella: <0.90 (12/07 1620)  Varicella: non-immune  GTT Early: hgb A1c 5.0               Third trimester: 137 RPR: Non Reactive (04/26 1604)   Rhogam n/a HBsAg: Negative (12/07 1620)   Vaccines TDAP:  11/27/20                      Flu Shot: Covid: HIV: Non Reactive (04/26 1604)   Baby Food                                GBS:   GC/CT:  Contraception  Pap: 06/26/20  CBB     CS/VBAC    Support Person [name]               Term labor symptoms and general obstetric precautions including but not limited to vaginal bleeding, contractions, leaking of fluid and fetal movement were reviewed in detail with the patient.    Return in about 1 week (around 01/31/2021) for rob.  Tresea Mall, CNM 01/24/2021 4:35 PM

## 2021-01-24 NOTE — Progress Notes (Signed)
ROB - increased cramping. RM 4

## 2021-02-01 ENCOUNTER — Encounter: Payer: 59 | Admitting: Obstetrics and Gynecology

## 2021-02-01 ENCOUNTER — Encounter: Payer: Self-pay | Admitting: Obstetrics and Gynecology

## 2021-02-01 ENCOUNTER — Other Ambulatory Visit: Payer: Self-pay

## 2021-02-01 ENCOUNTER — Ambulatory Visit (INDEPENDENT_AMBULATORY_CARE_PROVIDER_SITE_OTHER): Payer: 59 | Admitting: Obstetrics and Gynecology

## 2021-02-01 VITALS — BP 120/80 | Wt 196.0 lb

## 2021-02-01 DIAGNOSIS — O0990 Supervision of high risk pregnancy, unspecified, unspecified trimester: Secondary | ICD-10-CM

## 2021-02-01 NOTE — Progress Notes (Signed)
Routine Prenatal Care Visit  Subjective  Elizabeth Wolf is a 25 y.o. G2P1001 at [redacted]w[redacted]d being seen today for ongoing prenatal care.  She is currently monitored for the following issues for this low-risk pregnancy and has Rubella non-immune status, antepartum; Maternal varicella, non-immune; Atypical pigmented skin lesion; Birth control counseling; Insomnia; Depression with anxiety; Body mass index (BMI) of 35.0-35.9 in adult; Freckled skin; Inflamed skin tag; Supervision of high risk pregnancy, antepartum; and Obesity affecting pregnancy in first trimester on their problem list.  ----------------------------------------------------------------------------------- Patient reports no complaints.   Contractions: Irregular. Vag. Bleeding: None.  Movement: Present. Denies leaking of fluid.  ----------------------------------------------------------------------------------- The following portions of the patient's history were reviewed and updated as appropriate: allergies, current medications, past family history, past medical history, past social history, past surgical history and problem list. Problem list updated.   Objective  Blood pressure 120/80, weight 196 lb (88.9 kg), last menstrual period 04/30/2020. Pregravid weight 182 lb (82.6 kg) Total Weight Gain 14 lb (6.35 kg) Urinalysis:      Fetal Status: Fetal Heart Rate (bpm): 140 Fundal Height: 39 cm Movement: Present  Presentation: Vertex  General:  Alert, oriented and cooperative. Patient is in no acute distress.  Skin: Skin is warm and dry. No rash noted.   Cardiovascular: Normal heart rate noted  Respiratory: Normal respiratory effort, no problems with respiration noted  Abdomen: Soft, gravid, appropriate for gestational age. Pain/Pressure: Present     Pelvic:  Cervical exam performed Dilation: 2 Effacement (%): 20 Station: -3  Extremities: Normal range of motion.  Edema: None  Mental Status: Normal mood and affect. Normal behavior.  Normal judgment and thought content.     Assessment   25 y.o. G2P1001 at [redacted]w[redacted]d by  02/04/2021, by Last Menstrual Period presenting for routine prenatal visit  Plan   pregnancy 2 Problems (from 06/26/20 to present)     Problem Noted Resolved   Supervision of high risk pregnancy, antepartum 06/26/2020 by Tresea Mall, CNM No   Overview Addendum 11/27/2020  4:27 PM by Conard Novak, MD    [facility]inic [facility] Prenatal Labs  Dating EDD by LMP c/w 8w u/s Blood type: B/Positive/-- (12/07 1620)   Genetic Screen Did not get Antibody:Negative (12/07 1620)  Anatomic [place] complete Rubella: <0.90 (12/07 1620)  Varicella: non-immune  GTT Early: hgb A1c 5.0               Third trimester: 137 RPR: Non Reactive (04/26 1604)   Rhogam n/a HBsAg: Negative (12/07 1620)   Vaccines TDAP:  11/27/20                      Flu Shot: Covid: HIV: Non Reactive (04/26 1604)   Baby Food                                GBS:   GC/CT:  Contraception  Pap: 06/26/20  CBB     CS/VBAC    Support Person [name]              IOL scheduled for 02/10/2021 at 8 am Membranes swept at maternal request  Gestational age appropriate obstetric precautions including but not limited to vaginal bleeding, contractions, leaking of fluid and fetal movement were reviewed in detail with the patient.    Return in about 1 week (around 02/08/2021) for ROB in person.  Natale Milch MD [facility], [facility] 02/01/2021, 4:48 PM

## 2021-02-01 NOTE — Patient Instructions (Signed)
Covid Testing at 02/08/2021 between 8-10:30 am, Drive up testing in front of the [employer]. Follow signs for Pre-admission testing. Please wear a mask.  Induction 02/10/2021 at 8 AM . Enter through the [facility] for an 8 AM induction. Please enter through the ER for a midnight or 5 AM induction.  Please eat a meal prior to your arrival.   Labor Induction Labor induction is when steps are taken to cause a pregnant woman to begin the labor process. Most women go into labor on their own between 37 weeks and 42 weeks of pregnancy. When this does not happen, or when there is a medical needfor labor to begin, steps may be taken to induce, or bring on, labor. Labor induction causes a pregnant woman's uterus to contract. It also causes the cervix to soften (ripen), open (dilate), and thin out. Usually, labor is not induced before 39 weeks of pregnancyunless there is a medical reason to do so. When is labor induction considered? Labor induction may be right for you if: Your pregnancy lasts longer than 41 to 42 weeks. Your placenta is separating from your uterus (placental abruption). You have a rupture of membranes and your labor does not begin. You have health problems, like diabetes or high blood pressure (preeclampsia) during your pregnancy. Your baby has stopped growing or does not have enough amniotic fluid. Before labor induction begins, your health care provider will consider the following factors: Your medical condition and the baby's condition. How many weeks you have been pregnant. How mature the baby's lungs are. The condition of your cervix. The position of the baby. The size of your birth canal. Tell a health care provider about: Any allergies you have. All medicines you are taking, including vitamins, herbs, eye drops, creams, and over-the-counter medicines. Any problems you or your family members have had with anesthetic medicines. Any surgeries you have had. Any blood  disorders you have. Any medical conditions you have. What are the risks? Generally, this is a safe procedure. However, problems may occur, including: Failed induction. Changes in fetal heart rate, such as being too high, too low, or irregular (erratic). Infection in the mother or the baby. Increased risk of having a cesarean delivery. Breaking off (abruption) of the placenta from the uterus. This is rare. Rupture of the uterus. This is very rare. Your baby could fail to get enough blood flow or oxygen. This can be life-threatening. When induction is needed for medical reasons, the benefits generally outweighthe risks. What happens during the procedure? During the procedure, your health care provider will use one of these methods to induce labor: Stripping the membranes. In this method, the amniotic sac tissue is gently separated from the cervix. This causes the following to happen: Your cervix stretches, which in turn causes the release of prostaglandins. Prostaglandins induce labor and cause the uterus to contract. This procedure is often done in an office visit. You will be sent home to wait for contractions to begin. Prostaglandin medicine. This medicine starts contractions and causes the cervix to dilate and ripen. This can be taken by mouth (orally) or by being inserted into the vagina (suppository). Inserting a small, thin tube (catheter) with a balloon into the vagina and then expanding the balloon with water to dilate the cervix. Breaking the water. In this method, a small instrument is used to make a small hole in the amniotic sac. This eventually causes the amniotic sac to break. Contractions should begin within a few hours. Medicine  to trigger or strengthen contractions. This medicine is given through an IV that is inserted into a vein in your arm. This procedure may vary among health care providers and hospitals. Where to find more information March [employer]:  www.marchofdimes.org [employer]ians [employer]necologists: www.acog.org Summary Labor induction causes a pregnant woman's uterus to contract. It also causes the cervix to soften (ripen), open (dilate), and thin out. Labor is usually not induced before 39 weeks of pregnancy unless there is a medical reason to do so. When induction is needed for medical reasons, the benefits generally outweigh the risks. Talk with your health care provider about which methods of labor induction are right for you. This information is not intended to replace advice given to you by your health care provider. Make sure you discuss any questions you have with your healthcare provider. Document Revised: 04/19/2020 Document Reviewed: 04/19/2020 Elsevier Patient Education  2022 [employer].

## 2021-02-02 ENCOUNTER — Observation Stay
Admission: EM | Admit: 2021-02-02 | Discharge: 2021-02-02 | Disposition: A | Discharge status: Home / Self Care | Payer: 59 | Attending: Obstetrics and Gynecology | Admitting: Obstetrics and Gynecology

## 2021-02-02 ENCOUNTER — Encounter: Payer: Self-pay | Admitting: Obstetrics and Gynecology

## 2021-02-02 DIAGNOSIS — O99891 Other specified diseases and conditions complicating pregnancy: Secondary | ICD-10-CM

## 2021-02-02 DIAGNOSIS — N898 Other specified noninflammatory disorders of vagina: Secondary | ICD-10-CM | POA: Diagnosis not present

## 2021-02-02 DIAGNOSIS — Z3A39 39 weeks gestation of pregnancy: Secondary | ICD-10-CM | POA: Insufficient documentation

## 2021-02-02 NOTE — Discharge Summary (Signed)
Physician Final Progress Note  Patient ID: Elizabeth Wolf MRN: [id number] DOB/AGE: 1995/09/08 25 y.o.  Admit date: 02/02/2021 Admitting provider: Vena Austria, MD Discharge date: 02/02/2021   Admission Diagnoses: Labor and delivery indication for care or intervention  Discharge Diagnoses:  Active Problems:   Labor and delivery indication for care or intervention   25 y.o. G2P1001 at [redacted]w[redacted]d by Estimated Date of Delivery: 02/04/21 presenting to labor and delivery after calling the nurse advice line.  Patient was seen and checked in clinic yesterday noted to be 2cm dilated.  She passed mucous plug today and noted some rust colored staining to the discharge.  Discussed the fact that some spotting is to be expected following cervical checks as well as possible loss of mucous plug.  +FM, no LOF, no regular contractions.  Pregnancy uncomplicated to date.   Blood pressure 126/64, pulse 97, last menstrual period 04/30/2020.  pregnancy 2 Problems (from 06/26/20 to present)     Problem Noted Resolved   Supervision of high risk pregnancy, antepartum 06/26/2020 by Tresea Mall, CNM No   Overview Addendum 11/27/2020  4:27 PM by Conard Novak, MD    Clinic [facility] Prenatal Labs  Dating EDD by LMP c/w 8w u/s Blood type: B/Positive/-- (12/07 1620)   Genetic Screen Did not get Antibody:Negative (12/07 1620)  Anatomic [place] complete Rubella: <0.90 (12/07 1620)  Varicella: non-immune  GTT Early: hgb A1c 5.0               Third trimester: 137 RPR: Non Reactive (04/26 1604)   Rhogam n/a HBsAg: Negative (12/07 1620)   Vaccines TDAP:  11/27/20                      Flu Shot: Covid: HIV: Non Reactive (04/26 1604)   Baby Food                                GBS: negative GC/CT:  Contraception  Pap: 06/26/20  CBB     CS/VBAC    Support Person [name]              Consults: None  Significant Findings/ Diagnostic Studies: none  Procedures:  Baseline: 150 Variability:  moderate Accelerations: present Decelerations: absent Tocometry: 1 contraction in 30 minutes The patient was monitored for 30 minutes, fetal heart rate tracing was deemed reactive, category I tracing,  CPT [id number]   Discharge Condition: good  Disposition: Discharge disposition: 01-Home or Self Care       Diet: Regular diet  Discharge Activity: Activity as tolerated  Discharge Instructions     Discharge activity:  No Restrictions   Complete by: As directed    Discharge diet:  No restrictions   Complete by: As directed    Fetal Kick Count:  Lie on our left side for one hour after a meal, and count the number of times your baby kicks.  If it is less than 5 times, get up, move around and drink some juice.  Repeat the test 30 minutes later.  If it is still less than 5 kicks in an hour, notify your doctor.   Complete by: As directed    LABOR:  When conractions begin, you should start to time them from the beginning of one contraction to the beginning  of the next.  When contractions are 5 - 10 minutes apart or less and have been regular for at least an  hour, you should call your health care provider.   Complete by: As directed    No sexual activity restrictions   Complete by: As directed    Notify physician for bleeding from the vagina   Complete by: As directed    Notify physician for blurring of vision or spots before the eyes   Complete by: As directed    Notify physician for chills or fever   Complete by: As directed    Notify physician for fainting spells, "black outs" or loss of consciousness   Complete by: As directed    Notify physician for increase in vaginal discharge   Complete by: As directed    Notify physician for leaking of fluid   Complete by: As directed    Notify physician for pain or burning when urinating   Complete by: As directed    Notify physician for pelvic pressure (sudden increase)   Complete by: As directed    Notify physician for severe or  continued nausea or vomiting   Complete by: As directed    Notify physician for sudden gushing of fluid from the vagina (with or without continued leaking)   Complete by: As directed    Notify physician for sudden, constant, or occasional abdominal pain   Complete by: As directed    Notify physician if baby moving less than usual   Complete by: As directed       Allergies as of 02/02/2021   No Known Allergies      Medication List     TAKE these medications    escitalopram 10 MG tablet Commonly known as: Lexapro Take 1 tablet (10 mg total) by mouth daily.   Evening Primrose Oil 500 MG Caps Take by mouth.   multivitamin-prenatal 27-0.8 MG Tabs tablet Take 1 tablet by mouth daily at 12 noon.   omeprazole 20 MG capsule Commonly known as: PRILOSEC Take 20 mg by mouth daily.   ondansetron 4 MG disintegrating tablet Commonly known as: Zofran ODT Take 1 tablet (4 mg total) by mouth every 6 (six) hours as needed for nausea.         Total time spent taking care of this patient: 30 minutes  Signed: Vena Austria 02/02/2021, 9:54 PM

## 2021-02-02 NOTE — OB Triage Note (Signed)
Pt arrived to unit wheeled by ED staff for [facility] triage with complaints of loss of mucous plug. Patient reports she was checked in office yesterday and was noted to be 2/20/high yesterday. Patient placed on EFM monitor and TOCO to non tender area of abdomen. Patient denies vaginal bleeding or LOF. Patient oriented to room and advised of visitation policy due to Covid 19. Will notify provider of patient's arrival.

## 2021-02-02 NOTE — OB Triage Note (Signed)
Dr. Bonney Aid at bedside assessing patient and reviewing plan of care.Pt removed from monitor as patient has been notified that she will be discharged  to home.I, RN at bedside to assist as needed.

## 2021-02-02 NOTE — OB Triage Note (Signed)
SBAR called to Dr. Bonney Aid. MD stated he will be by to see patient and will place orders.

## 2021-02-05 ENCOUNTER — Encounter: Payer: Self-pay | Admitting: Obstetrics and Gynecology

## 2021-02-05 ENCOUNTER — Ambulatory Visit (INDEPENDENT_AMBULATORY_CARE_PROVIDER_SITE_OTHER): Payer: 59 | Admitting: Obstetrics and Gynecology

## 2021-02-05 ENCOUNTER — Other Ambulatory Visit: Payer: Self-pay

## 2021-02-05 ENCOUNTER — Telehealth: Payer: Self-pay

## 2021-02-05 VITALS — BP 120/80 | Wt 197.0 lb

## 2021-02-05 DIAGNOSIS — Z3A40 40 weeks gestation of pregnancy: Secondary | ICD-10-CM

## 2021-02-05 DIAGNOSIS — O0993 Supervision of high risk pregnancy, unspecified, third trimester: Secondary | ICD-10-CM

## 2021-02-05 DIAGNOSIS — O99213 Obesity complicating pregnancy, third trimester: Secondary | ICD-10-CM

## 2021-02-05 NOTE — Progress Notes (Signed)
Routine Prenatal Care Visit  Subjective  Elizabeth Wolf is a 25 y.o. G2P1001 at [redacted]w[redacted]d being seen today for ongoing prenatal care.  She is currently monitored for the following issues for this low-risk pregnancy and has Rubella non-immune status, antepartum; Maternal varicella, non-immune; Atypical pigmented skin lesion; Birth control counseling; Insomnia; Depression with anxiety; Body mass index (BMI) of 35.0-35.9 in adult; Freckled skin; Inflamed skin tag; Supervision of high risk pregnancy, antepartum; Obesity affecting pregnancy in first trimester; and Labor and delivery indication for care or intervention on their problem list.  ----------------------------------------------------------------------------------- Patient reports no complaints.   Contractions: Regular. Vag. Bleeding: None.  Movement: Present. Leaking Fluid denies.  ----------------------------------------------------------------------------------- The following portions of the patient's history were reviewed and updated as appropriate: allergies, current medications, past family history, past medical history, past social history, past surgical history and problem list. Problem list updated.  Objective  Blood pressure 120/80, weight 197 lb (89.4 kg), last menstrual period 04/30/2020. Pregravid weight 182 lb (82.6 kg) Total Weight Gain 15 lb (6.804 kg) Urinalysis: Urine Protein    Urine Glucose    Fetal Status: Fetal Heart Rate (bpm): present   Movement: Present  Presentation: Vertex  General:  Alert, oriented and cooperative. Patient is in no acute distress.  Skin: Skin is warm and dry. No rash noted.   Cardiovascular: Normal heart rate noted  Respiratory: Normal respiratory effort, no problems with respiration noted  Abdomen: Soft, gravid, appropriate for gestational age. Pain/Pressure: Present     Pelvic:  Cervical exam performed Dilation: 3 Effacement (%): 40 Station: -3  Extremities: Normal range of motion.  Edema:  None  Mental Status: Normal mood and affect. Normal behavior. Normal judgment and thought content.   Female chaperone present for pelvic exam:   Assessment   25 y.o. G2P1001 at [redacted]w[redacted]d by  02/04/2021, by Last Menstrual Period presenting for work-in prenatal visit  Plan   pregnancy 2 Problems (from 06/26/20 to present)     Problem Noted Resolved   Supervision of high risk pregnancy, antepartum 06/26/2020 by Tresea Mall, CNM No   Overview Addendum 11/27/2020  4:27 PM by Conard Novak, MD    Clinic [facility] Prenatal Labs  Dating EDD by LMP c/w 8w u/s Blood type: B/Positive/-- (12/07 1620)   Genetic Screen Did not get Antibody:Negative (12/07 1620)  Anatomic [place] complete Rubella: <0.90 (12/07 1620)  Varicella: non-immune  GTT Early: hgb A1c 5.0               Third trimester: 137 RPR: Non Reactive (04/26 1604)   Rhogam n/a HBsAg: Negative (12/07 1620)   Vaccines TDAP:  11/27/20                      Flu Shot: Covid: HIV: Non Reactive (04/26 1604)   Baby Food                                GBS:   GC/CT:  Contraception  Pap: 06/26/20  CBB     CS/VBAC    Support Person [name]               Term labor symptoms and general obstetric precautions including but not limited to vaginal bleeding, contractions, leaking of fluid and fetal movement were reviewed in detail with the patient. Please refer to After Visit Summary for other counseling recommendations.   Return for keep previously scheduled appts. Keep IOL appt  already scheduled   Thomasene Mohair, MD, [facility], [facility] 02/05/2021 3:27 PM

## 2021-02-05 NOTE — Telephone Encounter (Signed)
Dr. Jean Rosenthal had Elizabeth Wolf contact patient for scheduling. She was seen today at 3 pm.

## 2021-02-05 NOTE — Telephone Encounter (Signed)
Pt calling; has questions about IOL.  [phone]  Pt is scheduled for IOL on Sunday; wants to know who is on call; adv JEG/PH; pt states she has only seen SDJ the whole preg; is there any way her IOL can be scheduled with SDJ?  Also doesn't know what to expect.

## 2021-02-06 NOTE — Addendum Note (Signed)
Addended by: Thomasene Mohair D on: 02/06/2021 08:40 AM   Modules accepted: Orders, SmartSet

## 2021-02-07 ENCOUNTER — Inpatient Hospital Stay: Payer: 59 | Admitting: Anesthesiology

## 2021-02-07 ENCOUNTER — Encounter: Payer: 59 | Admitting: Advanced Practice Midwife

## 2021-02-07 ENCOUNTER — Other Ambulatory Visit: Payer: Self-pay

## 2021-02-07 ENCOUNTER — Encounter: Payer: Self-pay | Admitting: Obstetrics and Gynecology

## 2021-02-07 ENCOUNTER — Inpatient Hospital Stay
Admission: EM | Admit: 2021-02-07 | Discharge: 2021-02-09 | DRG: 806 | Disposition: A | Discharge status: Home / Self Care | Payer: 59 | Attending: Obstetrics and Gynecology | Admitting: Obstetrics and Gynecology

## 2021-02-07 DIAGNOSIS — O9081 Anemia of the puerperium: Secondary | ICD-10-CM | POA: Diagnosis not present

## 2021-02-07 DIAGNOSIS — Z3490 Encounter for supervision of normal pregnancy, unspecified, unspecified trimester: Secondary | ICD-10-CM | POA: Diagnosis present

## 2021-02-07 DIAGNOSIS — D62 Acute posthemorrhagic anemia: Secondary | ICD-10-CM | POA: Diagnosis not present

## 2021-02-07 DIAGNOSIS — Z20822 Contact with and (suspected) exposure to covid-19: Secondary | ICD-10-CM | POA: Diagnosis not present

## 2021-02-07 DIAGNOSIS — O99214 Obesity complicating childbirth: Principal | ICD-10-CM | POA: Diagnosis present

## 2021-02-07 DIAGNOSIS — Z3A40 40 weeks gestation of pregnancy: Secondary | ICD-10-CM

## 2021-02-07 DIAGNOSIS — Z2839 Other underimmunization status: Secondary | ICD-10-CM | POA: Diagnosis present

## 2021-02-07 DIAGNOSIS — O99211 Obesity complicating pregnancy, first trimester: Secondary | ICD-10-CM | POA: Diagnosis present

## 2021-02-07 DIAGNOSIS — O480 Post-term pregnancy: Secondary | ICD-10-CM

## 2021-02-07 DIAGNOSIS — O09899 Supervision of other high risk pregnancies, unspecified trimester: Secondary | ICD-10-CM

## 2021-02-07 DIAGNOSIS — O0990 Supervision of high risk pregnancy, unspecified, unspecified trimester: Secondary | ICD-10-CM

## 2021-02-07 DIAGNOSIS — O26893 Other specified pregnancy related conditions, third trimester: Secondary | ICD-10-CM | POA: Diagnosis present

## 2021-02-07 HISTORY — DX: Anxiety disorder, unspecified: F41.9

## 2021-02-07 LAB — CBC
HCT: 32.4 % — ABNORMAL LOW (ref 36.0–46.0)
Hemoglobin: 10.8 g/dL — ABNORMAL LOW (ref 12.0–15.0)
MCH: 26.9 pg (ref 26.0–34.0)
MCHC: 33.3 g/dL (ref 30.0–36.0)
MCV: 80.8 fL (ref 80.0–100.0)
Platelets: 211 10*3/uL (ref 150–400)
RBC: 4.01 MIL/uL (ref 3.87–5.11)
RDW: 14.4 % (ref 11.5–15.5)
WBC: 8.6 10*3/uL (ref 4.0–10.5)
nRBC: 0 % (ref 0.0–0.2)

## 2021-02-07 LAB — TYPE AND SCREEN
ABO/RH(D): B POS
Antibody Screen: NEGATIVE

## 2021-02-07 LAB — RESP PANEL BY RT-PCR (FLU A&B, COVID) ARPGX2
Influenza A by PCR: NEGATIVE
Influenza B by PCR: NEGATIVE
SARS Coronavirus 2 by RT PCR: NEGATIVE

## 2021-02-07 MED ORDER — AMMONIA AROMATIC IN INHA
RESPIRATORY_TRACT | Status: AC
  Filled 2021-02-07: qty 10

## 2021-02-07 MED ORDER — EPHEDRINE 5 MG/ML INJ: 10.0000 mg | INTRAVENOUS | Status: DC | PRN

## 2021-02-07 MED ORDER — SOD CITRATE-CITRIC ACID 500-334 MG/5ML PO SOLN: 30.0000 mL | ORAL | Status: DC | PRN

## 2021-02-07 MED ORDER — LIDOCAINE-EPINEPHRINE (PF) 1.5 %-1:200000 IJ SOLN
INTRAMUSCULAR | Status: DC | PRN
  Administered 2021-02-07: 3 mL via EPIDURAL

## 2021-02-07 MED ORDER — ONDANSETRON HCL 4 MG/2ML IJ SOLN
4.0000 mg | Freq: Four times a day (QID) | INTRAMUSCULAR | Status: DC | PRN
  Administered 2021-02-07: 4 mg via INTRAVENOUS
  Filled 2021-02-07: qty 2

## 2021-02-07 MED ORDER — BUPIVACAINE HCL (PF) 0.25 % IJ SOLN
INTRAMUSCULAR | Status: DC | PRN
  Administered 2021-02-07 (×2): 4 mL via EPIDURAL

## 2021-02-07 MED ORDER — FENTANYL 2.5 MCG/ML W/ROPIVACAINE 0.15% IN NS 100 ML EPIDURAL (ARMC)
EPIDURAL | Status: AC
  Filled 2021-02-07: qty 100

## 2021-02-07 MED ORDER — OXYTOCIN 10 UNIT/ML IJ SOLN
INTRAMUSCULAR | Status: AC
  Filled 2021-02-07: qty 2

## 2021-02-07 MED ORDER — TERBUTALINE SULFATE 1 MG/ML IJ SOLN: 0.2500 mg | Freq: Once | INTRAMUSCULAR | Status: DC | PRN

## 2021-02-07 MED ORDER — LACTATED RINGERS IV SOLN: 500.0000 mL | Freq: Once | INTRAVENOUS | Status: DC

## 2021-02-07 MED ORDER — PHENYLEPHRINE 40 MCG/ML (10ML) SYRINGE FOR IV PUSH (FOR BLOOD PRESSURE SUPPORT): 80.0000 ug | PREFILLED_SYRINGE | INTRAVENOUS | Status: DC | PRN

## 2021-02-07 MED ORDER — LIDOCAINE HCL (PF) 1 % IJ SOLN
INTRAMUSCULAR | Status: AC
  Filled 2021-02-07: qty 30

## 2021-02-07 MED ORDER — MISOPROSTOL 25 MCG QUARTER TABLET: 25.0000 ug | ORAL_TABLET | ORAL | Status: DC | PRN

## 2021-02-07 MED ORDER — LIDOCAINE HCL (PF) 1 % IJ SOLN: 30.0000 mL | INTRAMUSCULAR | Status: DC | PRN

## 2021-02-07 MED ORDER — OXYTOCIN BOLUS FROM INFUSION
333.0000 mL | Freq: Once | INTRAVENOUS | Status: AC
Start: 2021-02-07 — End: 2021-02-07
  Administered 2021-02-07: 333 mL via INTRAVENOUS

## 2021-02-07 MED ORDER — OXYTOCIN-SODIUM CHLORIDE 30-0.9 UT/500ML-% IV SOLN: 2.5000 [IU]/h | INTRAVENOUS | Status: DC

## 2021-02-07 MED ORDER — ACETAMINOPHEN 325 MG PO TABS
650.0000 mg | ORAL_TABLET | ORAL | Status: DC | PRN
  Administered 2021-02-07: 650 mg via ORAL
  Filled 2021-02-07: qty 2

## 2021-02-07 MED ORDER — FENTANYL 2.5 MCG/ML W/ROPIVACAINE 0.15% IN NS 100 ML EPIDURAL (ARMC)
EPIDURAL | Status: DC | PRN
  Administered 2021-02-07: 12 mL/h via EPIDURAL

## 2021-02-07 MED ORDER — OXYTOCIN-SODIUM CHLORIDE 30-0.9 UT/500ML-% IV SOLN
INTRAVENOUS | Status: AC
  Filled 2021-02-07: qty 500

## 2021-02-07 MED ORDER — OXYTOCIN 10 UNIT/ML IJ SOLN: 10.0000 [IU] | Freq: Once | INTRAMUSCULAR | Status: DC

## 2021-02-07 MED ORDER — DIPHENHYDRAMINE HCL 50 MG/ML IJ SOLN: 12.5000 mg | INTRAMUSCULAR | Status: DC | PRN

## 2021-02-07 MED ORDER — OXYTOCIN-SODIUM CHLORIDE 30-0.9 UT/500ML-% IV SOLN
1.0000 m[IU]/min | INTRAVENOUS | Status: DC
  Administered 2021-02-07: 1 m[IU]/min via INTRAVENOUS
  Filled 2021-02-07: qty 500

## 2021-02-07 MED ORDER — LIDOCAINE HCL (PF) 1 % IJ SOLN
INTRAMUSCULAR | Status: DC | PRN
  Administered 2021-02-07: 3 mL via SUBCUTANEOUS

## 2021-02-07 MED ORDER — IBUPROFEN 600 MG PO TABS
600.0000 mg | ORAL_TABLET | Freq: Four times a day (QID) | ORAL | Status: DC
  Administered 2021-02-07: 600 mg via ORAL
  Filled 2021-02-07 (×5): qty 1

## 2021-02-07 MED ORDER — LACTATED RINGERS IV SOLN: 500.0000 mL | INTRAVENOUS | Status: DC | PRN

## 2021-02-07 MED ORDER — LACTATED RINGERS IV SOLN: INTRAVENOUS | Status: DC

## 2021-02-07 MED ORDER — MISOPROSTOL 200 MCG PO TABS
ORAL_TABLET | ORAL | Status: AC
  Filled 2021-02-07: qty 4

## 2021-02-07 MED ORDER — FENTANYL 2.5 MCG/ML W/ROPIVACAINE 0.15% IN NS 100 ML EPIDURAL (ARMC): 12.0000 mL/h | EPIDURAL | Status: DC

## 2021-02-07 MED ORDER — BENZOCAINE-MENTHOL 20-0.5 % EX AERO
1.0000 "application " | INHALATION_SPRAY | CUTANEOUS | Status: DC | PRN
  Administered 2021-02-07: 1 via TOPICAL
  Filled 2021-02-07: qty 56

## 2021-02-07 NOTE — Anesthesia Procedure Notes (Signed)
Epidural Patient location during procedure: OB  Staffing Anesthesiologist: Piscitello, Cleda Mccreedy, MD Resident/CRNA: Irving Burton, CRNA Performed: resident/CRNA   Preanesthetic Checklist Completed: patient identified, IV checked, site marked, risks and benefits discussed, surgical consent, monitors and equipment checked, pre-op evaluation and timeout performed  Epidural Patient position: sitting Prep: ChloraPrep Patient monitoring: heart rate, continuous pulse ox and blood pressure Approach: midline Location: L3-L4 Injection technique: LOR saline  Needle:  Needle type: Tuohy  Needle gauge: 17 G Needle length: 9 cm and 9 Needle insertion depth: 8 cm Catheter type: closed end flexible Catheter size: 19 Gauge Catheter at skin depth: 12 cm Test dose: negative and 1.5% lidocaine with Epi 1:200 K  Assessment Events: blood not aspirated, injection not painful, no injection resistance, no paresthesia and negative IV test  Additional Notes 1 attempt Pt. Evaluated and documentation done after procedure finished. Patient identified. Risks/Benefits/Options discussed with patient including but not limited to bleeding, infection, nerve damage, paralysis, failed block, incomplete pain control, headache, blood pressure changes, nausea, vomiting, reactions to medication both or allergic, itching and postpartum back pain. Confirmed with bedside nurse the patient's most recent platelet count. Confirmed with patient that they are not currently taking any anticoagulation, have any bleeding history or any family history of bleeding disorders. Patient expressed understanding and wished to proceed. All questions were answered. Sterile technique was used throughout the entire procedure. Please see nursing notes for vital signs. Test dose was given through epidural catheter and negative prior to continuing to dose epidural or start infusion. Warning signs of high block given to the patient including  shortness of breath, tingling/numbness in hands, complete motor block, or any concerning symptoms with instructions to call for help. Patient was given instructions on fall risk and not to get out of bed. All questions and concerns addressed with instructions to call with any issues or inadequate analgesia.    Patient tolerated the insertion well without immediate complications.Reason for block:procedure for pain

## 2021-02-07 NOTE — Discharge Summary (Signed)
Postpartum Discharge Summary  Patient Name: Elizabeth Wolf DOB: [birth year] MRN: [id number]  Date of admission: 02/07/2021 Delivery date:02/07/2021  Delivering provider: Prentice Docker D  Date of discharge: 02/09/2021  Admitting diagnosis: Encounter for elective induction of labor [Z34.90] Intrauterine pregnancy: [redacted]w[redacted]d    Secondary diagnosis:  Principal Problem:   Encounter for elective induction of labor Active Problems:   Rubella non-immune status, antepartum   Maternal varicella, non-immune   Supervision of high risk pregnancy, antepartum   Obesity affecting pregnancy in first trimester   [redacted] weeks gestation of pregnancy  Additional problems: none    Discharge diagnosis: Term Pregnancy Delivered                                              Post partum procedures: none Augmentation: AROM and Pitocin Complications: None  Hospital course: Induction of Labor With Vaginal Delivery   25y.o. yo G2P1001 at 436w3das admitted to the hospital 02/07/2021 for induction of labor.  Indication for induction: Elective.  Patient had an uncomplicated labor course as follows: Membrane Rupture Time/Date: 5:54 PM ,02/07/2021   Delivery Method:Vaginal, Spontaneous  Episiotomy: None  Lacerations:  None  Details of delivery can be found in separate delivery note.  Patient had a routine postpartum course. Patient is discharged home 02/09/21.  Newborn Data: Birth date:02/07/2021  Birth time:9:12 PM  Gender:Female  Living status:Living  Apgars:8 ,9  Weight:3610 g   Magnesium Sulfate received: No BMZ received: No Rhophylac:N/A MMR:Yes T-DaP:Given prenatally on 11/27/2020 Transfusion:No  Physical exam  Vitals:   02/08/21 2127 02/09/21 0016 02/09/21 0352 02/09/21 0726  BP: 125/76 116/72 (!) 95/50 134/85  Pulse: 84 80 71 85  Resp: '20 18 18 18  ' Temp: 98 F (36.7 C) 97.8 F (36.6 C) 98.5 F (36.9 C) 98.6 F (37 C)  TempSrc: Oral Oral Oral Oral  SpO2: 99% 97% 98%   Weight:       Height:       General: alert, cooperative, and no distress Lochia: appropriate Uterine Fundus: firm Incision: N/A DVT Evaluation: No evidence of DVT seen on physical exam. Labs: Lab Results  Component Value Date   WBC 10.8 (H) 02/08/2021   HGB 9.3 (L) 02/08/2021   HCT 28.9 (L) 02/08/2021   MCV 80.7 02/08/2021   PLT 176 02/08/2021   No flowsheet data found. Edinburgh Score: Edinburgh Postnatal Depression Scale Screening Tool 02/08/2021  I have been able to laugh and see the funny side of things. 0  I have looked forward with enjoyment to things. 0  I have blamed myself unnecessarily when things went wrong. 2  I have been anxious or worried for no good reason. 3  I have felt scared or panicky for no good reason. 1  Things have been getting on top of me. 1  I have been so unhappy that I have had difficulty sleeping. 0  I have felt sad or miserable. 0  I have been so unhappy that I have been crying. 0  The thought of harming myself has occurred to me. 0  Edinburgh Postnatal Depression Scale Total 7      After visit meds:  Allergies as of 02/09/2021   No Known Allergies      Medication List     STOP taking these medications    escitalopram 10 MG tablet Commonly known  as: Lexapro   ondansetron 4 MG disintegrating tablet Commonly known as: Zofran ODT       TAKE these medications    Evening Primrose Oil 500 MG Caps Take by mouth.   multivitamin-prenatal 27-0.8 MG Tabs tablet Take 1 tablet by mouth daily at 12 noon.   omeprazole 20 MG capsule Commonly known as: PRILOSEC Take 20 mg by mouth daily.         Discharge home in stable condition Infant Feeding: Breast    Jaundice, under lights and with continued monitoring Infant Disposition:home with mother Discharge instruction: per After Visit Summary and Postpartum booklet. Activity: Advance as tolerated. Pelvic rest for 6 weeks.  Diet: routine diet Anticipated Birth Control: Unsure Postpartum  Appointment:2 weeks Additional Postpartum F/U: Postpartum Depression checkup Future Appointments:No future appointments. (Schedule in 2 weeks) Follow up Visit:  Follow-up Information     Will Bonnet, MD. Schedule an appointment as soon as possible for a visit in 2 week(s).   Specialty: Obstetrics and Gynecology Why: postpartum mood check Contact information: [address] [place] [address] [phone]                 SIGNED: Barnett Applebaum, MD, [facility], [facility] 02/09/2021  8:41 AM

## 2021-02-07 NOTE — H&P (Signed)
OB History & Physical   History of Present Illness:  Chief Complaint: induction of labor  HPI:  Elizabeth Wolf is a 25 y.o. G2P1001 female at [redacted]w[redacted]d dated by LMP consistent with an 8 week ultrasound.  Her pregnancy has been uncomplicated.    She reports irregular contractions.   She denies leakage of fluid.   She denies vaginal bleeding.   She reports fetal movement.    Total weight gain for pregnancy: 6.35 kg   Obstetrical Problem List: pregnancy 2 Problems (from 06/26/20 to present)     Problem Noted Resolved   Supervision of high risk pregnancy, antepartum 06/26/2020 by Tresea Mall, CNM No   Overview Addendum 11/27/2020  4:27 PM by Conard Novak, MD    Clinic [facility] Prenatal Labs  Dating EDD by LMP c/w 8w u/s Blood type: B/Positive/-- (12/07 1620)   Genetic Screen Did not get Antibody:Negative (12/07 1620)  Anatomic [place] complete Rubella: <0.90 (12/07 1620)  Varicella: non-immune  GTT Early: hgb A1c 5.0               Third trimester: 137 RPR: Non Reactive (04/26 1604)   Rhogam n/a HBsAg: Negative (12/07 1620)   Vaccines TDAP:  11/27/20                      Flu Shot: Covid: HIV: Non Reactive (04/26 1604)   Baby Food                                GBS:   GC/CT:  Contraception  Pap: 06/26/20  CBB     CS/VBAC    Support Person [name]               Maternal Medical History:   Past Medical History:  Diagnosis Date   ADD (attention deficit disorder)    Anxiety    Breakthrough bleeding on OCPs    Nausea & vomiting     Past Surgical History:  Procedure Laterality Date   WISDOM TOOTH EXTRACTION      No Known Allergies  Prior to Admission medications   Medication Sig Start Date End Date Taking? Authorizing Provider  Evening Primrose Oil 500 MG CAPS Take by mouth.   Yes [provider]  omeprazole (PRILOSEC) 20 MG capsule Take 20 mg by mouth daily.   Yes [provider]  Prenatal Vit-Fe Fumarate-FA (MULTIVITAMIN-PRENATAL) 27-0.8 MG TABS  tablet Take 1 tablet by mouth daily at 12 noon.   Yes [provider]  escitalopram (LEXAPRO) 10 MG tablet Take 1 tablet (10 mg total) by mouth daily. Patient not taking: Reported on 02/07/2021 01/25/20   Flinchum, Eula Fried, FNP  ondansetron (ZOFRAN ODT) 4 MG disintegrating tablet Take 1 tablet (4 mg total) by mouth every 6 (six) hours as needed for nausea. Patient not taking: Reported on 02/07/2021 06/26/20   Tresea Mall, CNM    OB History  Gravida Para Term Preterm AB Living  2 1 1  0 0 1  SAB IAB Ectopic Multiple Live Births  0 0 0 0 1    # Outcome Date GA Lbr Len/2nd Weight Sex Delivery Anes PTL Lv  2 Current           1 Term 01/23/18 [redacted]w[redacted]d / 01:37 3030 g F Vag-Spont EPI  LIV     Birth Comments: terminal meconium present at delivery    [facility]renatal care site: [facility]  Social History: She  reports that she has never smoked. She has never used smokeless tobacco. She reports that she does not drink alcohol and does not use drugs.  Family History: family history includes Cancer in her maternal grandmother and paternal grandfather; Diabetes in her mother; Heart disease in her paternal grandfather; Obesity in her father.   Review of Systems:  Review of Systems  Constitutional: Negative.   HENT: Negative.    Eyes: Negative.   Respiratory: Negative.    Cardiovascular: Negative.   Gastrointestinal: Negative.   Genitourinary: Negative.   Musculoskeletal: Negative.   Skin: Negative.   Neurological: Negative.   Psychiatric/Behavioral: Negative.      Physical Exam:  BP (!) 133/99 (BP Location: Left Arm)   Pulse 94   Temp 97.9 F (36.6 C) (Oral)   Resp 18   Ht 5\' 1"  (1.549 m)   Wt 88.9 kg   LMP 04/30/2020 (Exact Date)   SpO2 99%   BMI 37.03 kg/m   Physical Exam Constitutional:      General: She is not in acute distress.    Appearance: Normal appearance. She is well-developed.  Genitourinary:     Genitourinary Comments: 4 cm per RN  HENT:     Head:  Normocephalic and atraumatic.  Eyes:     General: No scleral icterus.    Conjunctiva/sclera: Conjunctivae normal.  Cardiovascular:     Rate and Rhythm: Normal rate and regular rhythm.     Heart sounds: No murmur heard.   No friction rub. No gallop.  Pulmonary:     Effort: Pulmonary effort is normal. No respiratory distress.     Breath sounds: Normal breath sounds. No wheezing or rales.  Abdominal:     General: Bowel sounds are normal. There is no distension.     Palpations: Abdomen is soft. There is mass.     Tenderness: There is no abdominal tenderness. There is no guarding or rebound.     Hernia: Hernia: gravid NT.  Musculoskeletal:        General: Normal range of motion.     Cervical back: Normal range of motion and neck supple.  Neurological:     General: No focal deficit present.     Mental Status: She is alert and oriented to person, place, and time.     Cranial Nerves: No cranial nerve deficit.  Skin:    General: Skin is warm and dry.     Findings: No erythema.  Psychiatric:        Mood and Affect: Mood normal.        Behavior: Behavior normal.        Judgment: Judgment normal.     Baseline FHR: 130 beats/min   Variability: moderate   Accelerations: present   Decelerations: absent Contractions: present frequency: irregular, infrequent Overall assessment: cat 1  Lab Results  Component Value Date   SARSCOV2NAA NEGATIVE 02/07/2021    Assessment:  Elizabeth Wolf is a 25 y.o. G60P1001 female at [redacted]w[redacted]d with elective induction of labor.   Plan:  Admit to Labor & Delivery  CBC, T&S, Clrs, IVF GBS negative.   Fetwal well-being: reassuring with cat 1 tracing Start pitocin. Discussed overall plan of care. All questions answered.    [redacted]w[redacted]d, MD 02/07/2021 11:56 AM

## 2021-02-07 NOTE — Anesthesia Preprocedure Evaluation (Signed)
Anesthesia Evaluation  Patient identified by MRN, date of birth, ID band Patient awake    Reviewed: Allergy & Precautions, H&P , NPO status , Patient's Chart, lab work & pertinent test results  Airway Mallampati: III  TM Distance: >3 FB Neck ROM: full    Dental  (+) Teeth Intact   Pulmonary    Pulmonary exam normal        Cardiovascular Normal cardiovascular exam     Neuro/Psych PSYCHIATRIC DISORDERS Anxiety Depression negative neurological ROS     GI/Hepatic Neg liver ROS, GERD  Medicated,  Endo/Other  negative endocrine ROS  Renal/GU negative Renal ROS  negative genitourinary   Musculoskeletal   Abdominal   Peds  Hematology negative hematology ROS (+)   Anesthesia Other Findings   Reproductive/Obstetrics (+) Pregnancy                             Anesthesia Physical Anesthesia Plan  ASA: 2  Anesthesia Plan: Epidural   Post-op Pain Management:  Regional for Post-op pain   Induction:   PONV Risk Score and Plan:   Airway Management Planned:   Additional Equipment:   Intra-op Plan:   Post-operative Plan:   Informed Consent: I have reviewed the patients History and Physical, chart, labs and discussed the procedure including the risks, benefits and alternatives for the proposed anesthesia with the patient or authorized representative who has indicated his/her understanding and acceptance.     Dental Advisory Given  Plan Discussed with: Anesthesiologist and CRNA  Anesthesia Plan Comments:         Anesthesia Quick Evaluation

## 2021-02-08 ENCOUNTER — Encounter: Payer: Self-pay | Admitting: Obstetrics and Gynecology

## 2021-02-08 LAB — CBC
HCT: 28.9 % — ABNORMAL LOW (ref 36.0–46.0)
Hemoglobin: 9.3 g/dL — ABNORMAL LOW (ref 12.0–15.0)
MCH: 26 pg (ref 26.0–34.0)
MCHC: 32.2 g/dL (ref 30.0–36.0)
MCV: 80.7 fL (ref 80.0–100.0)
Platelets: 176 10*3/uL (ref 150–400)
RBC: 3.58 MIL/uL — ABNORMAL LOW (ref 3.87–5.11)
RDW: 14.4 % (ref 11.5–15.5)
WBC: 10.8 10*3/uL — ABNORMAL HIGH (ref 4.0–10.5)
nRBC: 0 % (ref 0.0–0.2)

## 2021-02-08 LAB — RPR: RPR Ser Ql: NONREACTIVE

## 2021-02-08 MED ORDER — VARICELLA VIRUS VACCINE LIVE 1350 PFU/0.5ML IJ SUSR
0.5000 mL | INTRAMUSCULAR | Status: DC | PRN
  Filled 2021-02-08: qty 0.5

## 2021-02-08 MED ORDER — FERROUS SULFATE 325 (65 FE) MG PO TABS
325.0000 mg | ORAL_TABLET | Freq: Two times a day (BID) | ORAL | Status: DC
  Filled 2021-02-08 (×3): qty 1

## 2021-02-08 MED ORDER — MEASLES, MUMPS & RUBELLA VAC IJ SOLR
0.5000 mL | INTRAMUSCULAR | Status: DC | PRN
  Filled 2021-02-08: qty 0.5

## 2021-02-08 MED ORDER — SIMETHICONE 80 MG PO CHEW: 80.0000 mg | CHEWABLE_TABLET | ORAL | Status: DC | PRN

## 2021-02-08 MED ORDER — COCONUT OIL OIL
1.0000 "application " | TOPICAL_OIL | Status: DC | PRN
  Filled 2021-02-08: qty 120

## 2021-02-08 MED ORDER — HYDROCODONE-ACETAMINOPHEN 5-325 MG PO TABS: 1.0000 | ORAL_TABLET | Freq: Three times a day (TID) | ORAL | Status: DC | PRN

## 2021-02-08 MED ORDER — SENNOSIDES-DOCUSATE SODIUM 8.6-50 MG PO TABS
2.0000 | ORAL_TABLET | ORAL | Status: DC
  Administered 2021-02-08: 2 via ORAL
  Filled 2021-02-08: qty 2

## 2021-02-08 MED ORDER — DIPHENHYDRAMINE HCL 25 MG PO CAPS: 25.0000 mg | ORAL_CAPSULE | Freq: Four times a day (QID) | ORAL | Status: DC | PRN

## 2021-02-08 MED ORDER — ONDANSETRON HCL 4 MG PO TABS: 4.0000 mg | ORAL_TABLET | ORAL | Status: DC | PRN

## 2021-02-08 MED ORDER — PRENATAL MULTIVITAMIN CH
1.0000 | ORAL_TABLET | Freq: Every day | ORAL | Status: DC
  Administered 2021-02-08: 1 via ORAL
  Filled 2021-02-08: qty 1

## 2021-02-08 MED ORDER — ONDANSETRON HCL 4 MG/2ML IJ SOLN: 4.0000 mg | INTRAMUSCULAR | Status: DC | PRN

## 2021-02-08 MED ORDER — WITCH HAZEL-GLYCERIN EX PADS: 1.0000 "application " | MEDICATED_PAD | CUTANEOUS | Status: DC | PRN

## 2021-02-08 MED ORDER — DIBUCAINE (PERIANAL) 1 % EX OINT: 1.0000 "application " | TOPICAL_OINTMENT | CUTANEOUS | Status: DC | PRN

## 2021-02-08 NOTE — Lactation Note (Addendum)
This note was copied from a baby's chart. Lactation Consultation Note  Patient Name: Elizabeth Wolf [id number] Date: 02/08/2021 Reason for consult: Initial assessment;Term Age:27 hours  Maternal Data Has patient been taught Hand Expression?: Yes Does the patient have breastfeeding experience prior to this delivery?: Yes How long did the patient breastfeed?: 2  Feeding Mother's Current Feeding Choice: Breast Milk Attempts made at latching with nipple shield, baby would not suck, pulls tongue up to roof of mouth, baby sl gaggy, pulls in chin and lower lip, tight jaw, , mom hand expressed drops of colostrum into spoon and this was fed to baby, baby licked milk from spoon    LATCH Score Latch: Repeated attempts needed to sustain latch, nipple held in mouth throughout feeding, stimulation needed to elicit sucking reflex.  Audible Swallowing: None  Type of Nipple: Flat  Comfort (Breast/Nipple): Soft / non-tender  Hold (Positioning): Assistance needed to correctly position infant at breast and maintain latch.  LATCH Score: 5   Lactation Tools Discussed/Used Tools: Nipple Shields Nipple shield size: 20  Interventions Interventions: Breast feeding basics reviewed;Assisted with latch;Skin to skin;Hand express;Breast compression;Adjust position;Support pillows;Position options Mom to continue attempting with cues, LC name and no written on white board Discharge Pump: Personal WIC Program: No  Consult Status Consult Status: Follow-up Date: 02/08/21 Follow-up type: In-patient    Dyann Kief 02/08/2021, 12:48 PM

## 2021-02-08 NOTE — Anesthesia Postprocedure Evaluation (Signed)
Anesthesia Post Note  Patient: Elizabeth Wolf  Procedure(s) Performed: AN AD HOC LABOR EPIDURAL  Patient location during evaluation: Mother Baby Anesthesia Type: Epidural Level of consciousness: awake and alert and oriented Pain management: pain level controlled Vital Signs Assessment: post-procedure vital signs reviewed and stable Respiratory status: respiratory function stable Cardiovascular status: stable Postop Assessment: no headache, no backache, adequate PO intake, able to ambulate, patient able to bend at knees and no apparent nausea or vomiting Anesthetic complications: no   No notable events documented.   Last Vitals:  Vitals:   02/08/21 0115 02/08/21 0332  BP:  119/75  Pulse:  77  Resp: 18 20  Temp: 36.7 C 36.7 C  SpO2:  98%    Last Pain:  Vitals:   02/08/21 0559  TempSrc:   PainSc: 0-No pain                 [name]

## 2021-02-08 NOTE — Progress Notes (Signed)
Patient ID: Elizabeth Wolf, female   DOB: 1996/03/22, 25 y.o.   MRN: [id number]   Subjective:  She is feeling well.  Pain control is controlled. Voiding without difficulty. Tolerating a regular diet. Ambulating well.  Objective:   Blood pressure 122/81, pulse 61, temperature 97.7 F (36.5 C), temperature source Oral, resp. rate 18, height 5\' 1"  (1.549 m), weight 88.9 kg, last menstrual period 04/30/2020, SpO2 100 %, unknown if currently breastfeeding.  General: NAD Pulmonary: no increased work of breathing Abdomen: non-distended, non-tender Uterus:  fundus firm at U; lochia normal Extremities: no edema, no erythema, no tenderness, no signs of DVT  Results for orders placed or performed during the hospital encounter of 02/07/21 (from the past 72 hour(s))  CBC     Status: Abnormal   Collection Time: 02/07/21  8:04 AM  Result Value Ref Range   WBC 8.6 4.0 - 10.5 K/uL   RBC 4.01 3.87 - 5.11 MIL/uL   Hemoglobin 10.8 (L) 12.0 - 15.0 g/dL   HCT 02/09/21 (L) 84.6 - 96.2 %   MCV 80.8 80.0 - 100.0 fL   MCH 26.9 26.0 - 34.0 pg   MCHC 33.3 30.0 - 36.0 g/dL   RDW 95.2 84.1 - 32.4 %   Platelets 211 150 - 400 K/uL   nRBC 0.0 0.0 - 0.2 %    Comment: Performed at [facility], [address] [place], [place]  Type and screen     Status: None   Collection Time: 02/07/21  8:04 AM  Result Value Ref Range   ABO/RH(D) B POS    Antibody Screen NEG    Sample Expiration      02/10/2021,2359 Performed at [facility], [address] [place], [place]   RPR     Status: None   Collection Time: 02/07/21  8:04 AM  Result Value Ref Range   RPR Ser Ql NON REACTIVE NON REACTIVE    Comment: Performed at [facility], [address] [place], [place]  Resp Panel by RT-PCR (Flu A&B, Covid) Nasopharyngeal Swab     Status: None   Collection Time: 02/07/21  8:04 AM   Specimen: Nasopharyngeal Swab; Nasopharyngeal(NP) swabs in vial transport medium   Result Value Ref Range   SARS Coronavirus 2 by RT PCR NEGATIVE NEGATIVE    Comment: (NOTE) SARS-CoV-2 target nucleic acids are NOT DETECTED.  The SARS-CoV-2 RNA is generally detectable in upper respiratory specimens during the acute phase of infection. The lowest concentration of SARS-CoV-2 viral copies this assay can detect is 138 copies/mL. A negative result does not preclude SARS-Cov-2 infection and should not be used as the sole basis for treatment or other patient management decisions. A negative result may occur with  improper specimen collection/handling, submission of specimen other than nasopharyngeal swab, presence of viral mutation(s) within the areas targeted by this assay, and inadequate number of viral copies(<138 copies/mL). A negative result must be combined with clinical observations, patient history, and epidemiological information. The expected result is Negative.  Fact Sheet for Patients:  02/09/21  Fact Sheet for Healthcare Providers:  BloggerCourse.com  This test is no t yet approved or cleared by the [employer] FDA and  has been authorized for detection and/or diagnosis of SARS-CoV-2 by FDA under an Emergency Use Authorization (EUA). This EUA will remain  in effect (meaning this test can be used) for the duration of the COVID-19 declaration under Section 564(b)(1) of the Act, 21 U.S.C.section 360bbb-3(b)(1),  unless the authorization is terminated  or revoked sooner.       Influenza A by PCR NEGATIVE NEGATIVE   Influenza B by PCR NEGATIVE NEGATIVE    Comment: (NOTE) The Xpert Xpress SARS-CoV-2/FLU/RSV plus assay is intended as an aid in the diagnosis of influenza from Nasopharyngeal swab specimens and should not be used as a sole basis for treatment. Nasal washings and aspirates are unacceptable for Xpert Xpress SARS-CoV-2/FLU/RSV testing.  Fact Sheet for  Patients: BloggerCourse.com  Fact Sheet for Healthcare Providers: SeriousBroker.it  This test is not yet approved or cleared by the [place] FDA and has been authorized for detection and/or diagnosis of SARS-CoV-2 by FDA under an Emergency Use Authorization (EUA). This EUA will remain in effect (meaning this test can be used) for the duration of the COVID-19 declaration under Section 564(b)(1) of the Act, 21 U.S.C. section 360bbb-3(b)(1), unless the authorization is terminated or revoked.  Performed at [facility], [address] [place], [place] [address]   CBC     Status: Abnormal   Collection Time: 02/08/21  6:26 AM  Result Value Ref Range   WBC 10.8 (H) 4.0 - 10.5 K/uL   RBC 3.58 (L) 3.87 - 5.11 MIL/uL   Hemoglobin 9.3 (L) 12.0 - 15.0 g/dL   HCT 11.9 (L) 41.7 - 40.8 %   MCV 80.7 80.0 - 100.0 fL   MCH 26.0 26.0 - 34.0 pg   MCHC 32.2 30.0 - 36.0 g/dL   RDW 14.4 81.8 - 56.3 %   Platelets 176 150 - 400 K/uL   nRBC 0.0 0.0 - 0.2 %    Comment: Performed at [facility], [address] [place], [place] [address]    Assessment:   25 y.o. [phone] postpartum day #1  Plan:   1) Acute blood loss anemia - hemodynamically stable and asymptomatic - po ferrous sulfate  2) Blood Type --/--/B POS (07/21 0804)   3) Rubella <0.90 (12/07 1620) / Varicella nonimmune  4) TDAP status -up-to-date Immunization History  Administered Date(s) Administered   Influenza,inj,Quad PF,6+ Mos 08/21/2017   PFIZER(Purple Top)SARS-COV-2 Vaccination 03/02/2020, 04/02/2020   Tdap 11/10/2017, 11/27/2020    5) Feeding-breast  6) Contraception - not discussed  7) Disposition -continue care  Adelene Idler MD [facility], [facility] 02/08/2021 1:06 PM

## 2021-02-08 NOTE — Lactation Note (Addendum)
This note was copied from a baby's chart. Lactation Consultation Note  Patient Name: Elizabeth Wolf [id number]'L Date: 02/08/2021 Reason for consult: Follow-up assessment Age:27 hours  Maternal Data    Feeding Mother's Current Feeding Choice: Breast Milk Baby rooting, assisted mom with latching baby to left breast in football hold and baby latched easier with nipple shield and fed for 10 min with swallows heard LATCH Score Latch: Grasps breast easily, tongue down, lips flanged, rhythmical sucking.  Audible Swallowing: Spontaneous and intermittent  Type of Nipple: Flat  Comfort (Breast/Nipple): Soft / non-tender  Hold (Positioning): Assistance needed to correctly position infant at breast and maintain latch.  LATCH Score: 8   Lactation Tools Discussed/Used Tools: Nipple Shields Nipple shield size: 20  Interventions Interventions: Hand express;Support pillows;Education  Discharge [facility] Program: No  Consult Status Consult Status: PRN Date: 02/08/21 Follow-up type: In-patient    Dyann Kief 02/08/2021, 3:20 PM

## 2021-02-09 NOTE — Progress Notes (Signed)
Admit Date: 02/07/2021 Today's Date: 02/09/2021  Post Partum Day 2  Subjective:  no complaints, up ad lib, voiding, tolerating PO, and infant breast feeding and being followed for jaundice  Objective: Temp:  [97.7 F (36.5 C)-98.6 F (37 C)] 98.6 F (37 C) (07/23 0726) Pulse Rate:  [61-85] 85 (07/23 0726) Resp:  [18-20] 18 (07/23 0726) BP: (95-134)/(50-85) 134/85 (07/23 0726) SpO2:  [97 %-99 %] 98 % (07/23 0352)  Physical Exam:  General: alert, cooperative, and no distress Lochia: appropriate Uterine Fundus: firm Incision: none DVT Evaluation: No evidence of DVT seen on physical exam.  Recent Labs    02/07/21 0804 02/08/21 0626  HGB 10.8* 9.3*  HCT 32.4* 28.9*    Assessment/Plan: Discharge home, Breastfeeding, Contraception (undecided), and Infant doing well although still has required monitoring and treatment for jaundice Pt may need to room in w infant until he is ready for discharge   LOS: 2 days   Letitia Libra [facility] 02/09/2021, 8:42 AM

## 2021-02-09 NOTE — Discharge Instructions (Signed)

## 2021-02-09 NOTE — Progress Notes (Addendum)
Patient discharged, infant remains as ped patient. Discharge instructions, when to follow up, reviewed with patient.  Patient verbalized understanding.

## 2021-02-12 ENCOUNTER — Telehealth: Payer: Self-pay

## 2021-02-12 NOTE — Telephone Encounter (Signed)
Shari Prows from [facility] calling to confirm delivery; needs del date, del type, next appt and estimated return to work date.  [phone]  x [phone]  Information left on Elizabeth Wolf's vm.

## 2021-02-15 NOTE — Telephone Encounter (Signed)
Shari Prows from [employer] called 4:32pm 02/13/21 for delivery information.  I do not respond b/c I had left him a detailed message on his vm on the 26th thinking he just hasn't listened to it yet.  Received another call from Shari Prows; called number and left another detailed message on main number instead of Ivan's vm.

## 2021-02-17 ENCOUNTER — Emergency Department
Admission: EM | Admit: 2021-02-17 | Discharge: 2021-02-17 | Disposition: A | Discharge status: Home / Self Care | Payer: 59 | Attending: Emergency Medicine | Admitting: Emergency Medicine

## 2021-02-17 ENCOUNTER — Emergency Department: Payer: 59

## 2021-02-17 ENCOUNTER — Encounter: Payer: Self-pay | Admitting: Emergency Medicine

## 2021-02-17 ENCOUNTER — Other Ambulatory Visit: Payer: Self-pay

## 2021-02-17 DIAGNOSIS — R109 Unspecified abdominal pain: Secondary | ICD-10-CM | POA: Diagnosis not present

## 2021-02-17 DIAGNOSIS — R509 Fever, unspecified: Secondary | ICD-10-CM | POA: Diagnosis not present

## 2021-02-17 DIAGNOSIS — R110 Nausea: Secondary | ICD-10-CM | POA: Diagnosis not present

## 2021-02-17 DIAGNOSIS — R000 Tachycardia, unspecified: Secondary | ICD-10-CM | POA: Diagnosis not present

## 2021-02-17 DIAGNOSIS — Z79899 Other long term (current) drug therapy: Secondary | ICD-10-CM | POA: Insufficient documentation

## 2021-02-17 DIAGNOSIS — U071 COVID-19: Secondary | ICD-10-CM | POA: Diagnosis not present

## 2021-02-17 LAB — URINALYSIS, COMPLETE (UACMP) WITH MICROSCOPIC
Bilirubin Urine: NEGATIVE
Glucose, UA: NEGATIVE mg/dL
Hgb urine dipstick: NEGATIVE
Ketones, ur: NEGATIVE mg/dL
Nitrite: NEGATIVE
Protein, ur: 30 mg/dL — AB
Specific Gravity, Urine: 1.029 (ref 1.005–1.030)
pH: 5 (ref 5.0–8.0)

## 2021-02-17 LAB — COMPREHENSIVE METABOLIC PANEL
ALT: 36 U/L (ref 0–44)
AST: 29 U/L (ref 15–41)
Albumin: 3.2 g/dL — ABNORMAL LOW (ref 3.5–5.0)
Alkaline Phosphatase: 107 U/L (ref 38–126)
Anion gap: 6 (ref 5–15)
BUN: 11 mg/dL (ref 6–20)
CO2: 21 mmol/L — ABNORMAL LOW (ref 22–32)
Calcium: 8.6 mg/dL — ABNORMAL LOW (ref 8.9–10.3)
Chloride: 109 mmol/L (ref 98–111)
Creatinine, Ser: 0.74 mg/dL (ref 0.44–1.00)
GFR, Estimated: >60 mL/min (ref >60)
Glucose, Bld: 98 mg/dL (ref 70–99)
Potassium: 4.1 mmol/L (ref 3.5–5.1)
Sodium: 136 mmol/L (ref 135–145)
Total Bilirubin: 0.6 mg/dL (ref 0.3–1.2)
Total Protein: 6.7 g/dL (ref 6.5–8.1)

## 2021-02-17 LAB — CBC WITH DIFFERENTIAL/PLATELET
Abs Immature Granulocytes: 0.02 10*3/uL (ref 0.00–0.07)
Basophils Absolute: 0 10*3/uL (ref 0.0–0.1)
Basophils Relative: 1 %
Eosinophils Absolute: 0 10*3/uL (ref 0.0–0.5)
Eosinophils Relative: 0 %
HCT: 36.7 % (ref 36.0–46.0)
Hemoglobin: 12.1 g/dL (ref 12.0–15.0)
Immature Granulocytes: 1 %
Lymphocytes Relative: 18 %
Lymphs Abs: 0.7 10*3/uL (ref 0.7–4.0)
MCH: 26.8 pg (ref 26.0–34.0)
MCHC: 33 g/dL (ref 30.0–36.0)
MCV: 81.2 fL (ref 80.0–100.0)
Monocytes Absolute: 0.7 10*3/uL (ref 0.1–1.0)
Monocytes Relative: 17 %
Neutro Abs: 2.6 10*3/uL (ref 1.7–7.7)
Neutrophils Relative %: 63 %
Platelets: 262 10*3/uL (ref 150–400)
RBC: 4.52 MIL/uL (ref 3.87–5.11)
RDW: 14.6 % (ref 11.5–15.5)
WBC: 4 10*3/uL (ref 4.0–10.5)
nRBC: 0 % (ref 0.0–0.2)

## 2021-02-17 LAB — CHLAMYDIA/NGC RT PCR (ARMC ONLY)
Chlamydia Tr: NOT DETECTED
N gonorrhoeae: NOT DETECTED

## 2021-02-17 LAB — WET PREP, GENITAL
Clue Cells Wet Prep HPF POC: NONE SEEN
Sperm: NONE SEEN
Trich, Wet Prep: NONE SEEN
Yeast Wet Prep HPF POC: NONE SEEN

## 2021-02-17 LAB — RESP PANEL BY RT-PCR (FLU A&B, COVID) ARPGX2
Influenza A by PCR: NEGATIVE
Influenza B by PCR: NEGATIVE
SARS Coronavirus 2 by RT PCR: POSITIVE — AB

## 2021-02-17 MED ORDER — LACTATED RINGERS IV BOLUS
1000.0000 mL | Freq: Once | INTRAVENOUS | Status: AC
  Administered 2021-02-17: 1000 mL via INTRAVENOUS

## 2021-02-17 MED ORDER — NIRMATRELVIR/RITONAVIR (PAXLOVID)TABLET: 3.0000 | ORAL_TABLET | Freq: Two times a day (BID) | ORAL | 0 refills | Status: AC

## 2021-02-17 NOTE — ED Notes (Signed)
Pt in XR. 

## 2021-02-17 NOTE — ED Provider Notes (Signed)
[facility] Emergency Department Provider Note   ____________________________________________   Event Date/Time   First MD Initiated Contact with Patient 02/17/21 [phone]     (approximate)  I have reviewed the triage vital signs and the nursing notes.   HISTORY  Chief Complaint Fever and Nausea    HPI Elizabeth Wolf is a 25 y.o. female with no significant past medical history who presents to the ED complaining of fever.  Patient reports that yesterday she started feeling subjective fevers and chills, noticed her temperature was 103.6 a couple of hours prior to arrival today.  She reports taking Tylenol this morning after she discovered the fever.  She has been dealing with cough, congestion, and eye drainage for the past 24 hours, states her child has been dealing with similar symptoms.  She denies any chest pain or shortness of breath, endorses nausea but denies vomiting or diarrhea.  She did have an uncomplicated vaginal delivery 10 days ago, states she has been doing well since then.  She does state that the bleeding seemed to stop 2 days ago, but yesterday she developed darker colored vaginal bleeding and has had to change her pad 3 times a day since then.  She denies any vaginal discharge, does endorse intermittent sharp lower abdominal pain but denies any pain currently.  She has not had any dysuria or flank pain.        Past Medical History:  Diagnosis Date   ADD (attention deficit disorder)    Anxiety    Breakthrough bleeding on OCPs    Nausea & vomiting     Patient Active Problem List   Diagnosis Date Noted   Encounter for elective induction of labor 02/07/2021   [redacted] weeks gestation of pregnancy 02/07/2021   Labor and delivery indication for care or intervention 02/02/2021   Supervision of high risk pregnancy, antepartum 06/26/2020   Obesity affecting pregnancy in first trimester 06/26/2020   Atypical pigmented skin lesion 01/25/2020   Birth  control counseling 01/25/2020   Insomnia 01/25/2020   Depression with anxiety 01/25/2020   Body mass index (BMI) of 35.0-35.9 in adult 01/25/2020   Freckled skin 01/25/2020   Inflamed skin tag 01/25/2020   Rubella non-immune status, antepartum 07/06/2017   Maternal varicella, non-immune 07/06/2017    Past Surgical History:  Procedure Laterality Date   WISDOM TOOTH EXTRACTION      Prior to Admission medications   Medication Sig Start Date End Date Taking? Authorizing Provider  nirmatrelvir/ritonavir EUA (PAXLOVID) TABS Take 3 tablets by mouth 2 (two) times daily for 5 days. Patient GFR is >60. Take nirmatrelvir (150 mg) two tablets twice daily for 5 days and ritonavir (100 mg) one tablet twice daily for 5 days. 02/17/21 02/22/21 Yes Chesley Noon, MD  Evening Primrose Oil 500 MG CAPS Take by mouth.    [provider]  omeprazole (PRILOSEC) 20 MG capsule Take 20 mg by mouth daily.    [provider]  Prenatal Vit-Fe Fumarate-FA (MULTIVITAMIN-PRENATAL) 27-0.8 MG TABS tablet Take 1 tablet by mouth daily at 12 noon.    [provider]    Allergies Patient has no known allergies.  Family History  Problem Relation Age of Onset   Diabetes Mother    Cancer Paternal Grandfather    Heart disease Paternal Grandfather    Obesity Father    Cancer Maternal Grandmother     Social History Social History   Tobacco Use   Smoking status: Never   Smokeless  tobacco: Never  Vaping Use   Vaping Use: Never used  Substance Use Topics   Alcohol use: No   Drug use: No    Review of Systems  Constitutional: Positive for fever/chills Eyes: No visual changes. ENT: No sore throat.  Positive for congestion. Cardiovascular: Denies chest pain. Respiratory: Denies shortness of breath.  Positive for cough. Gastrointestinal: Positive for abdominal pain and nausea, no vomiting.  No diarrhea.  No constipation. Genitourinary: Negative for dysuria. Musculoskeletal: Negative  for back pain. Skin: Negative for rash. Neurological: Negative for headaches, focal weakness or numbness.  ____________________________________________   PHYSICAL EXAM:  VITAL SIGNS: ED Triage Vitals  Enc Vitals Group     BP 02/17/21 0917 (!) 129/114     Pulse Rate 02/17/21 0917 (!) 147     Resp 02/17/21 0917 20     Temp 02/17/21 0917 99.2 F (37.3 C)     Temp Source 02/17/21 0917 Oral     SpO2 02/17/21 0917 98 %     Weight 02/17/21 0917 178 lb (80.7 kg)     Height 02/17/21 0917 5\' 1"  (1.549 m)     Head Circumference --      Peak Flow --      Pain Score 02/17/21 0924 0     Pain Loc --      Pain Edu? --      Excl. in GC? --     Constitutional: Alert and oriented. Eyes: Conjunctivae are normal. Head: Atraumatic. Nose: No congestion/rhinnorhea. Mouth/Throat: Mucous membranes are moist. Neck: Normal ROM Cardiovascular: Tachycardic, regular rhythm. Grossly normal heart sounds.  2+ radial pulses bilaterally. Respiratory: Normal respiratory effort.  No retractions. Lungs CTAB. Gastrointestinal: Soft and nontender.  No CVA tenderness bilaterally.  No distention. Genitourinary: Cervical irritation noted without discharge or bleeding, no cervical motion or nasal tenderness noted. Musculoskeletal: No lower extremity tenderness nor edema. Neurologic:  Normal speech and language. No gross focal neurologic deficits are appreciated. Skin:  Skin is warm, dry and intact. No rash noted. Psychiatric: Mood and affect are normal. Speech and behavior are normal.  ____________________________________________   LABS (all labs ordered are listed, but only abnormal results are displayed)  Labs Reviewed  RESP PANEL BY RT-PCR (FLU A&B, COVID) ARPGX2 - Abnormal; Notable for the following components:      Result Value   SARS Coronavirus 2 by RT PCR POSITIVE (*)    All other components within normal limits  WET PREP, GENITAL - Abnormal; Notable for the following components:   WBC, Wet Prep  HPF POC FEW (*)    All other components within normal limits  COMPREHENSIVE METABOLIC PANEL - Abnormal; Notable for the following components:   CO2 21 (*)    Calcium 8.6 (*)    Albumin 3.2 (*)    All other components within normal limits  URINALYSIS, COMPLETE (UACMP) WITH MICROSCOPIC - Abnormal; Notable for the following components:   Color, Urine YELLOW (*)    APPearance HAZY (*)    Protein, ur 30 (*)    Leukocytes,Ua TRACE (*)    Bacteria, UA RARE (*)    All other components within normal limits  CHLAMYDIA/NGC RT PCR ([facility] ONLY)            CBC WITH DIFFERENTIAL/PLATELET   ____________________________________________  EKG  ED ECG REPORT I, 02/19/21, the attending physician, personally viewed and interpreted this ECG.   Date: 02/17/2021  EKG Time: 9:22  Rate: 131  Rhythm: sinus tachycardia  Axis: Normal  Intervals:none  ST&T Change: None   PROCEDURES  Procedure(s) performed (including Critical Care):  Procedures   ____________________________________________   INITIAL IMPRESSION / ASSESSMENT AND PLAN / ED COURSE      25 year old female with no significant past medical history presents to the ED complaining of fever, cough, congestion, abdominal pain, and vaginal bleeding approximately 10 days from uncomplicated vaginal delivery.  Patient is overall well-appearing, noted to be tachycardic but remainder of vital signs are reassuring, low suspicion for sepsis.  Etiology of her fever seems most likely to be viral given her cough and congestion along with sick contact, however we will also assess for retained products of conception given her abdominal pain and recent worsening bleeding.  Pelvic exam is reassuring with no bleeding or discharge noted, no cervical motion or adnexal tenderness noted and her abdominal exam is without tenderness.  We will further assess with ultrasound.  Labs are reassuring, chest x-ray reviewed by me with no infiltrate, edema, or effusion.   Testing for COVID-19 and influenza is pending, UA is also pending.  We will hydrate patient with IV fluids and reassess.  Heart rate is gradually improving following IV fluids, labs are also reassuring.  Testing for COVID-19 is positive, which is likely the source of her fever.  She is not in any respiratory distress and continues to maintain O2 sats on room air.  Chest x-ray reviewed by me and shows no infiltrate, edema, or effusion.  Pelvic ultrasound cannot exclude retained products of conception and case was discussed with Dr. Jerene Pitch of OB/GYN.  She has reviewed ultrasound images and findings most likely consistent with recurrent bleeding given normal endometrial thickness.  Patient is appropriate for discharge home with OB/GYN and PCP follow-up, will be prescribed Paxlovid for management of her COVID-19.  She was counseled to return to the ED for new worsening symptoms, patient agrees with plan.      ____________________________________________   FINAL CLINICAL IMPRESSION(S) / ED DIAGNOSES  Final diagnoses:  Fever, unspecified fever cause  COVID-19     ED Discharge Orders          Ordered    nirmatrelvir/ritonavir EUA (PAXLOVID) TABS  2 times daily        02/17/21 1226             Note:  This document was prepared using Dragon voice recognition software and may include unintentional dictation errors.    Chesley Noon, MD 02/17/21 1228

## 2021-02-17 NOTE — ED Notes (Signed)
US bedside

## 2021-02-17 NOTE — ED Triage Notes (Signed)
Pt to ED via POV, pt is 10 days post partum. Pt states that yesterday she started running fever, this morning her temp was 103.6, pt took tylenol around 0800. Pt reports that she is also having eye irritation and nausea. Pt reports that her abdominal cramping had stopped but it has not started back. Pt is tachycardic in triage at this time.

## 2021-02-17 NOTE — ED Notes (Signed)
E-signature pad unavailable - Pt verbalized understanding of D/C information - no additional concerns at this time.  

## 2021-02-18 ENCOUNTER — Telehealth: Payer: Self-pay

## 2021-02-18 NOTE — Telephone Encounter (Signed)
Pt called after  hour nurse 02/17/21 6;14PM; states she has covid, high fever; seen in ED; was rx'd paxlovid and she wants to know if she is able to take tylenol with the paxlovid as she is breastfeedling; current temp 103.5.  After hour nurse adv pt per [facility] there are no interactions between the two durgs but that the Paxloviddoes pass through the breast milk.  Called pt to f/u; states she wasn't told that Paxlovid passes through the breast milk so she hasn't taken any more; states it didn't seem to help anyway; is taking e.s. tylenol 2 q4-6 hrs while awake; her temp this am was 99.6 and that was when she was due for another dose of tylenol; states she is better in that regard; states this is day three; asked if can be with her family after 5th day of isolation.  Adv to call the HD; number given.

## 2022-01-03 DIAGNOSIS — Z1389 Encounter for screening for other disorder: Secondary | ICD-10-CM | POA: Diagnosis not present

## 2022-01-03 DIAGNOSIS — Z1331 Encounter for screening for depression: Secondary | ICD-10-CM | POA: Diagnosis not present

## 2022-01-03 DIAGNOSIS — R42 Dizziness and giddiness: Secondary | ICD-10-CM | POA: Diagnosis not present

## 2022-01-03 DIAGNOSIS — R208 Other disturbances of skin sensation: Secondary | ICD-10-CM | POA: Diagnosis not present

## 2022-01-03 DIAGNOSIS — Z0000 Encounter for general adult medical examination without abnormal findings: Secondary | ICD-10-CM | POA: Diagnosis not present

## 2022-01-03 DIAGNOSIS — F411 Generalized anxiety disorder: Secondary | ICD-10-CM | POA: Diagnosis not present

## 2022-01-30 DIAGNOSIS — E669 Obesity, unspecified: Secondary | ICD-10-CM | POA: Diagnosis not present

## 2022-01-30 DIAGNOSIS — F411 Generalized anxiety disorder: Secondary | ICD-10-CM | POA: Diagnosis not present

## 2022-01-30 DIAGNOSIS — Z131 Encounter for screening for diabetes mellitus: Secondary | ICD-10-CM | POA: Diagnosis not present

## 2022-01-30 DIAGNOSIS — Z1331 Encounter for screening for depression: Secondary | ICD-10-CM | POA: Diagnosis not present

## 2022-01-30 DIAGNOSIS — Z0000 Encounter for general adult medical examination without abnormal findings: Secondary | ICD-10-CM | POA: Diagnosis not present

## 2022-01-30 DIAGNOSIS — Z1159 Encounter for screening for other viral diseases: Secondary | ICD-10-CM | POA: Diagnosis not present

## 2022-01-30 DIAGNOSIS — R208 Other disturbances of skin sensation: Secondary | ICD-10-CM | POA: Diagnosis not present

## 2022-01-30 DIAGNOSIS — Z1389 Encounter for screening for other disorder: Secondary | ICD-10-CM | POA: Diagnosis not present

## 2022-01-30 DIAGNOSIS — R42 Dizziness and giddiness: Secondary | ICD-10-CM | POA: Diagnosis not present

## 2022-01-30 DIAGNOSIS — Z13220 Encounter for screening for lipoid disorders: Secondary | ICD-10-CM | POA: Diagnosis not present

## 2022-02-24 ENCOUNTER — Other Ambulatory Visit: Payer: Self-pay

## 2022-02-24 MED ORDER — SERTRALINE HCL 50 MG PO TABS
ORAL_TABLET | ORAL | 2 refills | Status: AC
  Filled 2022-02-24: qty 90, 90d supply, fill #0

## 2022-03-05 DIAGNOSIS — F411 Generalized anxiety disorder: Secondary | ICD-10-CM | POA: Diagnosis not present

## 2022-03-28 ENCOUNTER — Other Ambulatory Visit: Payer: Self-pay

## 2022-04-09 ENCOUNTER — Other Ambulatory Visit: Payer: Self-pay

## 2022-04-09 DIAGNOSIS — F411 Generalized anxiety disorder: Secondary | ICD-10-CM | POA: Diagnosis not present

## 2022-04-09 MED ORDER — HYDROXYZINE HCL 10 MG PO TABS
ORAL_TABLET | ORAL | 3 refills | Status: AC
  Filled 2022-04-09: qty 30, 30d supply, fill #0

## 2022-04-21 DIAGNOSIS — Z1389 Encounter for screening for other disorder: Secondary | ICD-10-CM | POA: Diagnosis not present

## 2022-04-21 DIAGNOSIS — F411 Generalized anxiety disorder: Secondary | ICD-10-CM | POA: Diagnosis not present

## 2022-05-09 ENCOUNTER — Other Ambulatory Visit: Payer: Self-pay

## 2022-05-09 DIAGNOSIS — Z0000 Encounter for general adult medical examination without abnormal findings: Secondary | ICD-10-CM | POA: Diagnosis not present

## 2022-05-09 DIAGNOSIS — E669 Obesity, unspecified: Secondary | ICD-10-CM | POA: Diagnosis not present

## 2022-05-09 DIAGNOSIS — Z1389 Encounter for screening for other disorder: Secondary | ICD-10-CM | POA: Diagnosis not present

## 2022-05-09 DIAGNOSIS — F411 Generalized anxiety disorder: Secondary | ICD-10-CM | POA: Diagnosis not present

## 2022-05-09 MED ORDER — ONDANSETRON 4 MG PO TBDP
ORAL_TABLET | ORAL | 0 refills | Status: AC
  Filled 2022-05-09: qty 30, 5d supply, fill #0
  Filled 2022-05-27: qty 30, 10d supply, fill #0

## 2022-05-27 ENCOUNTER — Other Ambulatory Visit: Payer: Self-pay

## 2022-06-05 DIAGNOSIS — F411 Generalized anxiety disorder: Secondary | ICD-10-CM | POA: Diagnosis not present

## 2022-06-05 IMAGING — US US PELVIS COMPLETE
1 series · 15 of 25 positions shown · non-contrast
Comparison: None

CLINICAL DATA: Fever. Ten days postpartum. Evaluate for retained
products of conception.

EXAM:
TRANSABDOMINAL AND TRANSVAGINAL ULTRASOUND OF PELVIS
TECHNIQUE: Both transabdominal and transvaginal ultrasound examinations of the
pelvis were performed. Transabdominal technique was performed for
global imaging of the pelvis including uterus, ovaries, adnexal
regions, and pelvic cul-de-sac. It was necessary to proceed with
endovaginal exam following the transabdominal exam to visualize the
endometrium and ovaries.

[Series 1: us pelvis complete · 37 acquisitions, 15 frames shown]
[im 1/37]
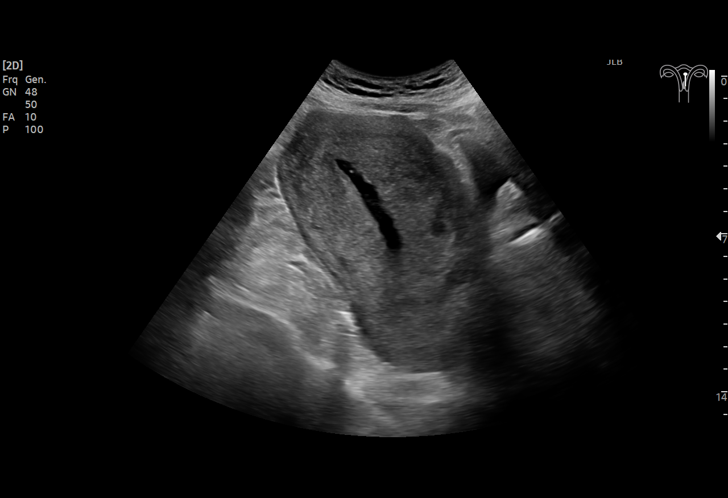
[im 4/37]
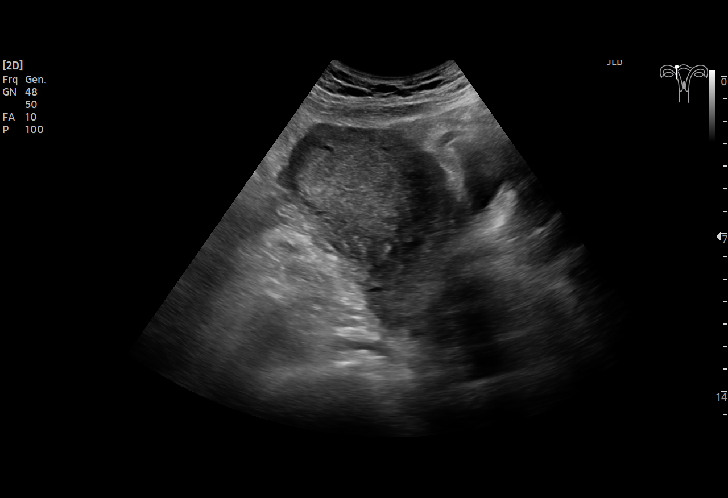
[im 7/37]
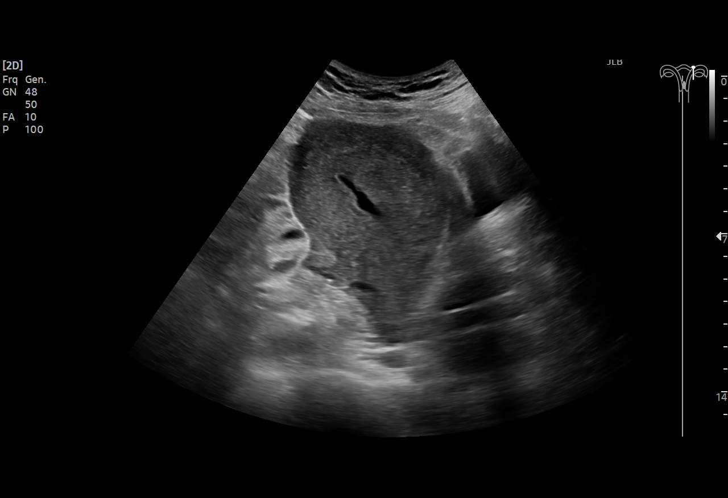
[im 8/37]
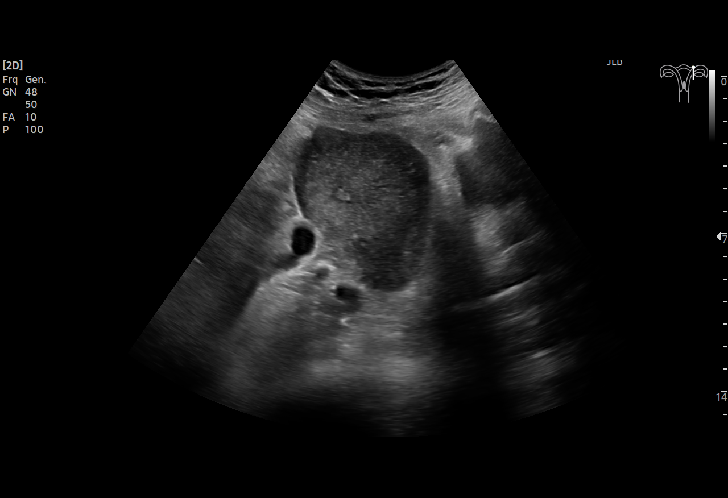
[im 11/37]
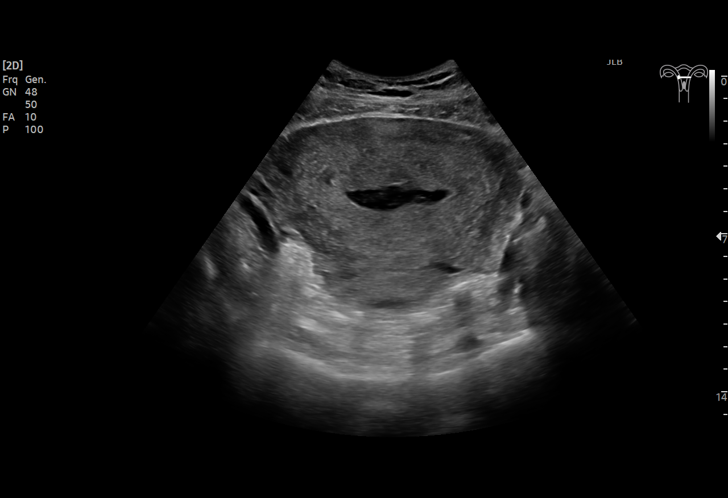
[im 14/37]
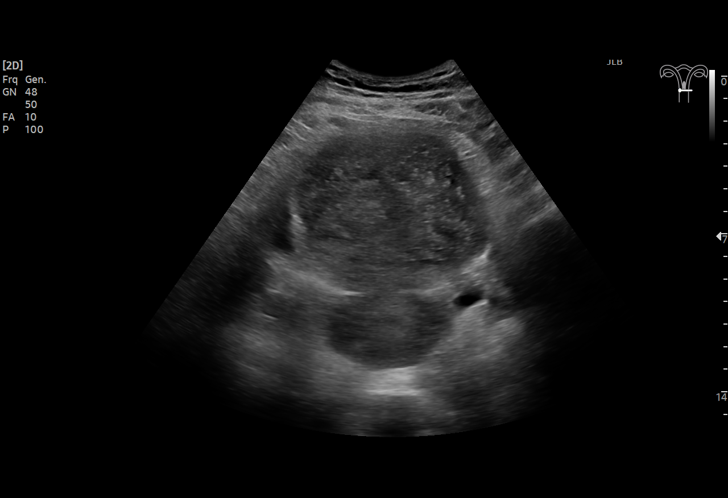
[im 16/37]
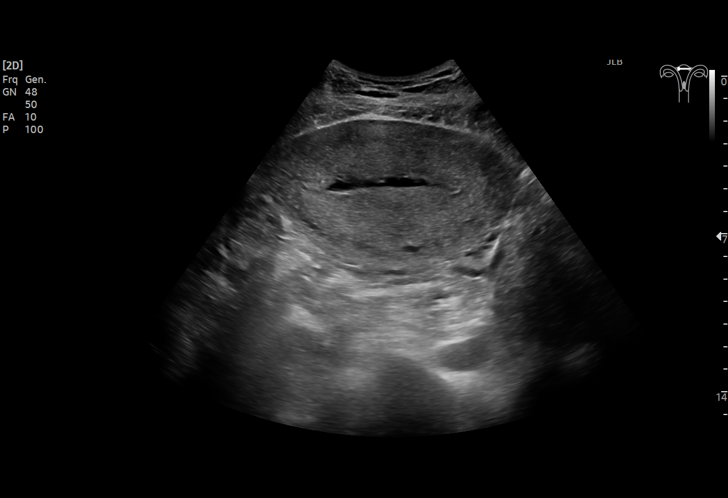
[im 19/37]
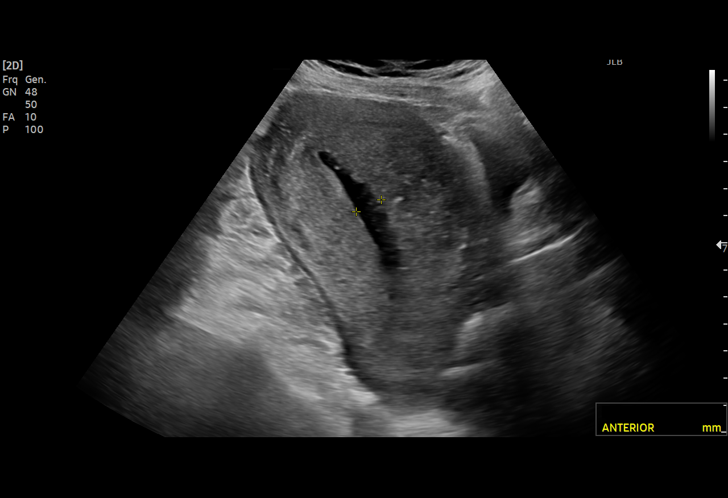
[im 22/37]
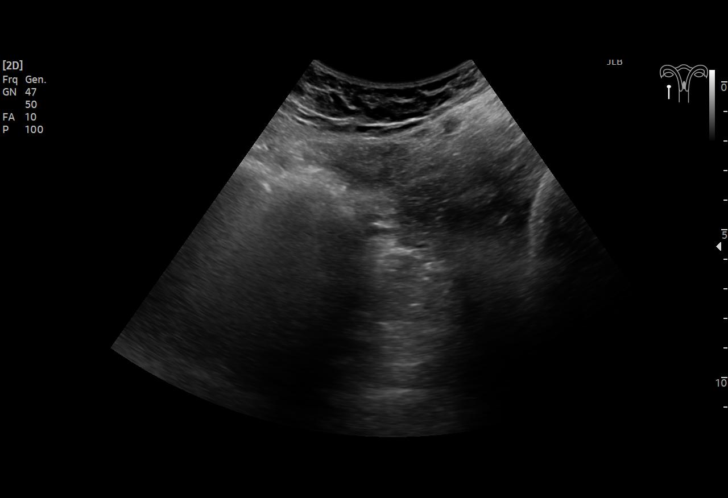
[im 23/37]
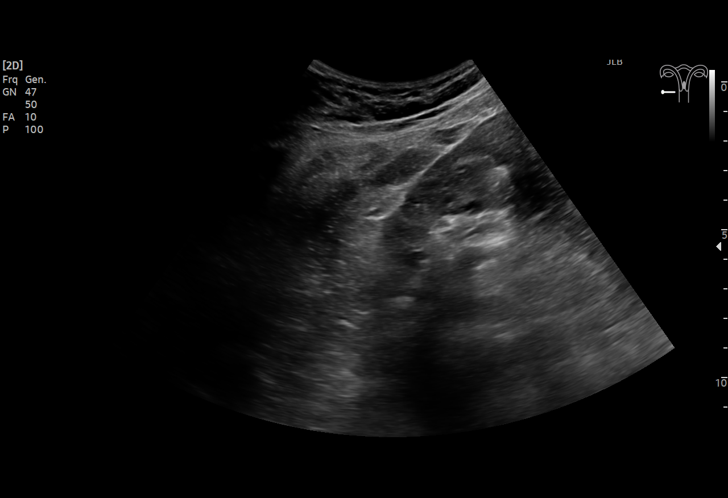
[im 26/37]
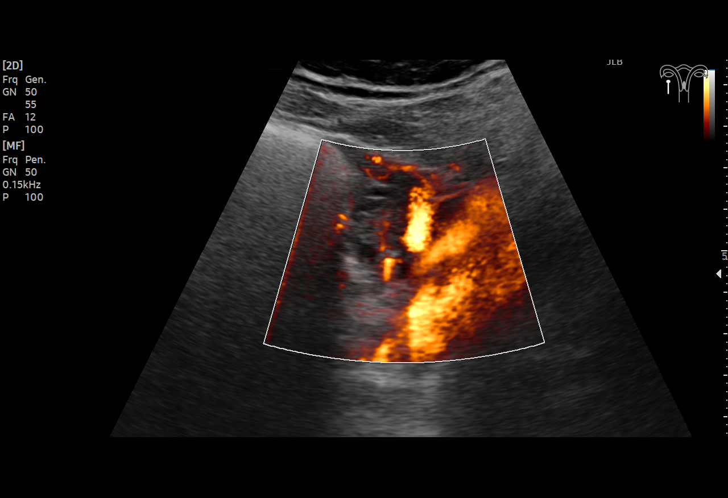
[im 29/37]
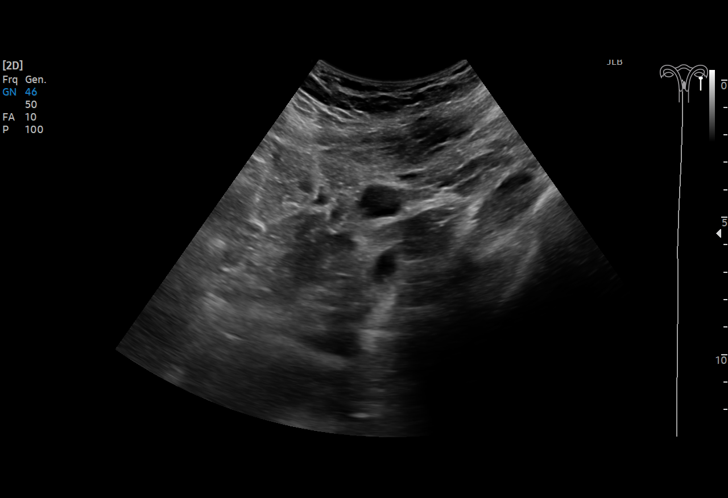
[im 31/37]
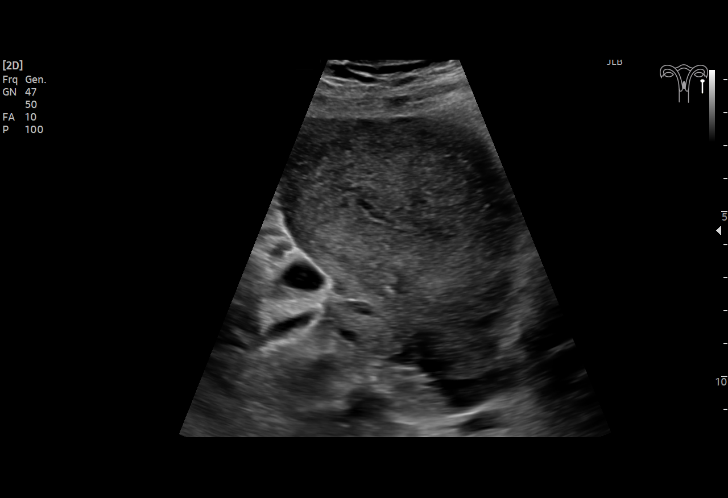
[im 34/37]
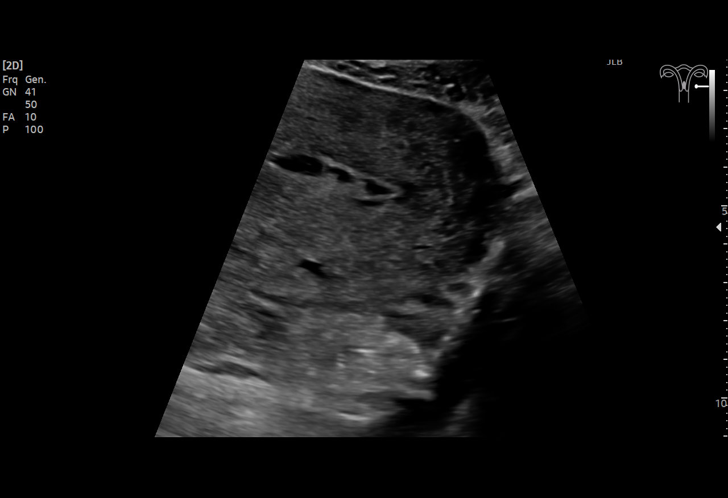
[im 37/37]
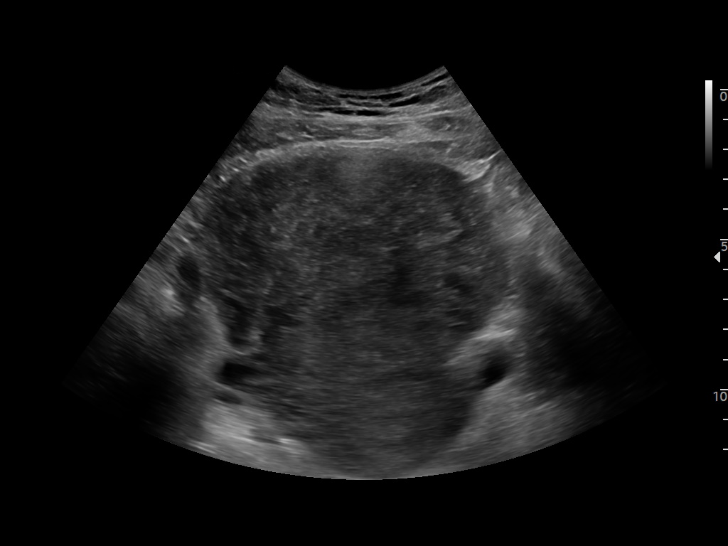

[15 of 25 positions shown; findings below may reference images not displayed]

FINDINGS: Uterus

Measurements: 12.9 x 7.0 x 9.8 cm = volume: 466.1 mL. No fibroids or
other mass visualized.

Endometrium

Thickness: Measured 10 mm including fluid within the canal. The
endometrial canal is fluid-filled. There is debris within the fluid.
The endometrial surface is mildly irregular but not masslike. No
increased vascularity in the region of the endometrial canal.

Right ovary

Measurements: 2.3 x 2.1 x 2.4 cm = volume: 6.0 mL. Normal
appearance/no adnexal mass.

Left ovary

Measurements: 3.2 x 1.4 x 1.9 cm = volume: 4.4 mL. Normal
appearance/no adnexal mass.

Other findings

No abnormal free fluid.
IMPRESSION: 1. There is fluid in the endometrial canal which contains debris.
Including the fluid, the endometrial stripe complex measures 10 mm.
However, the endometrium, which is mildly irregular, is
significantly less than 10 mm in thickness. There is no associated
hypervascularity. The findings are nonspecific and could represent
endometrial blood products or sequela of infection given history of
fever. Hypovascular retained products of conception are not
completely excluded. Consider short-term follow-up pelvic ultrasound
or further evaluation with pelvic MRI with and without IV contrast,
as clinically warranted.

## 2022-06-05 IMAGING — CR DG CHEST 2V
2 series · 2 of 2 positions shown · non-contrast
Comparison: None.

CLINICAL DATA: Ten days postpartum, fever, nausea

EXAM:
CHEST - 2 VIEW

[chest pa]
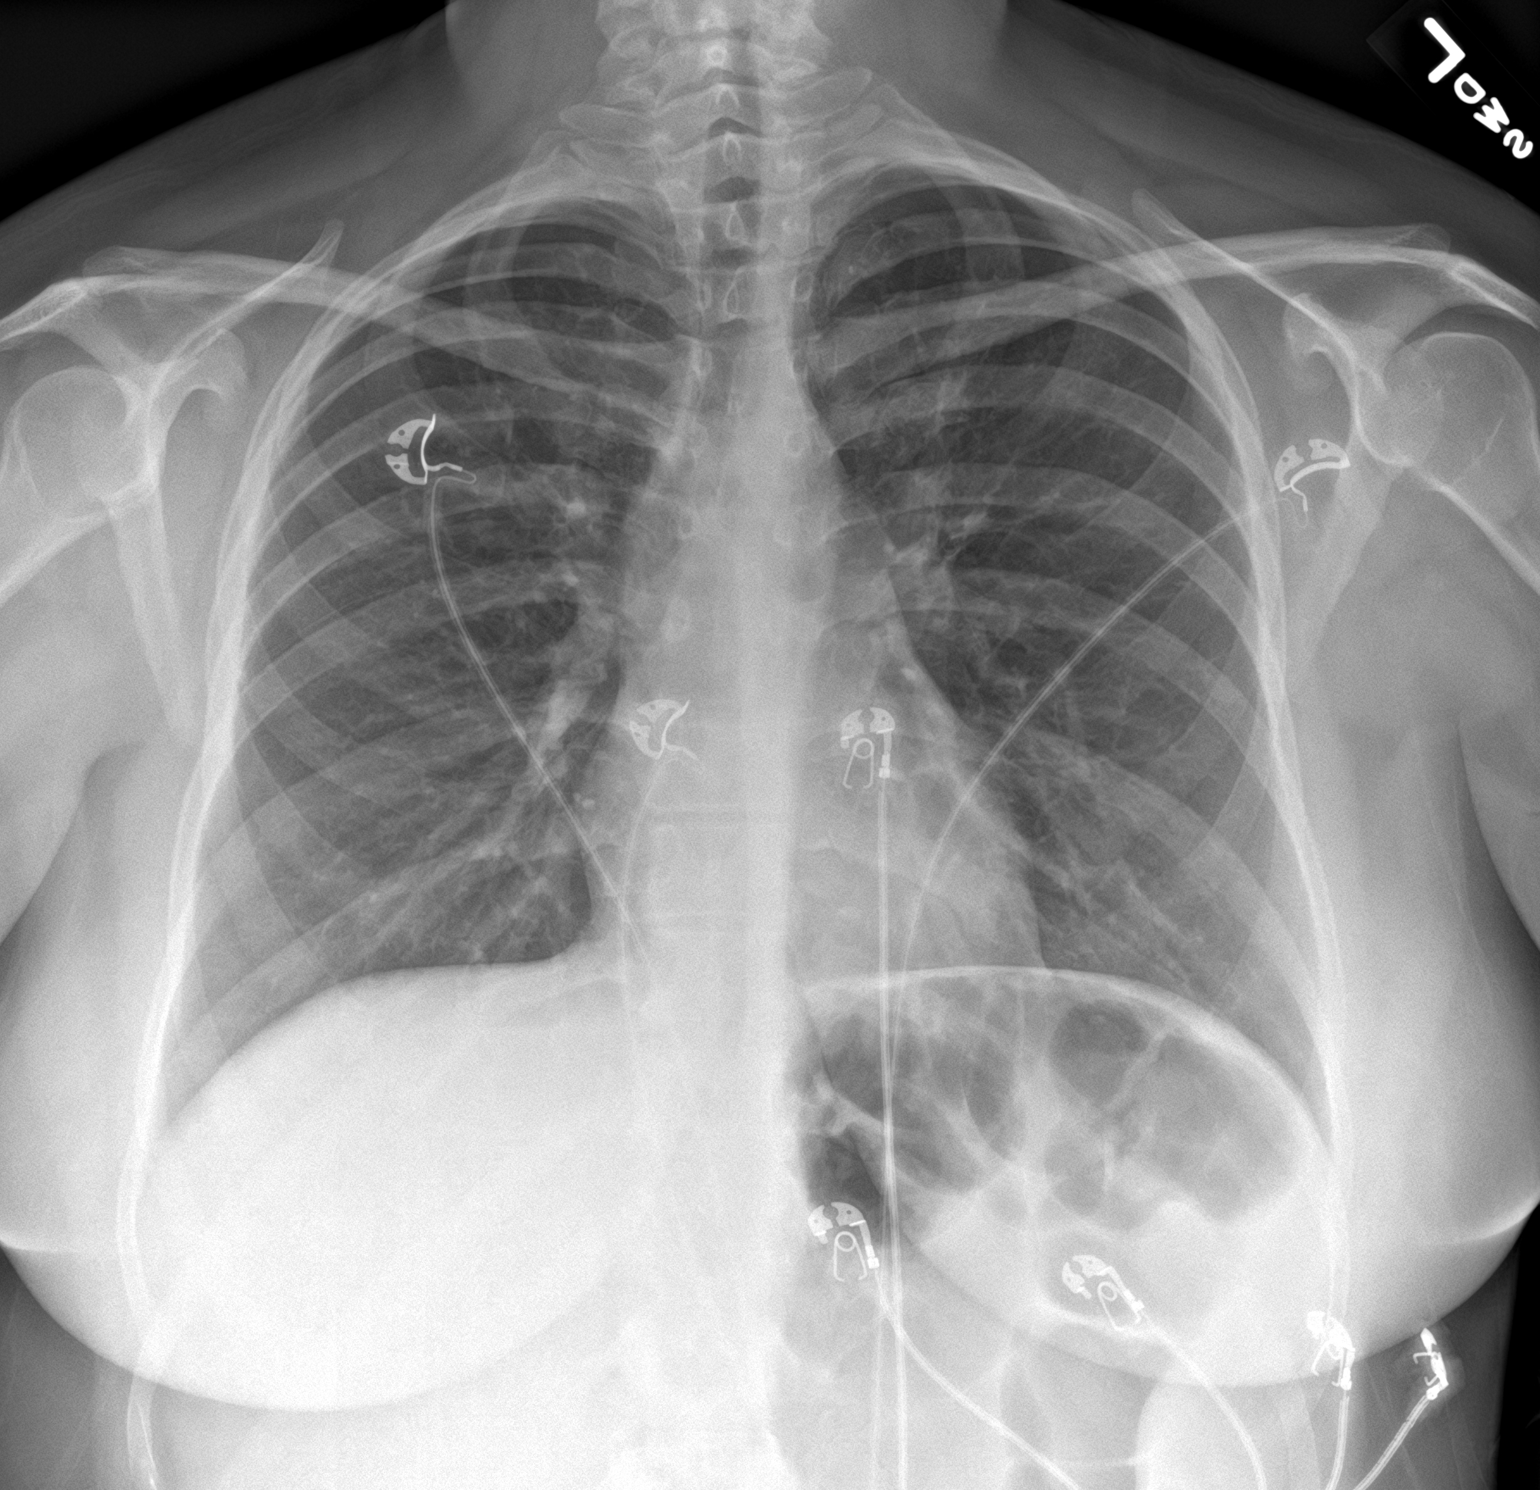

[chest lat]
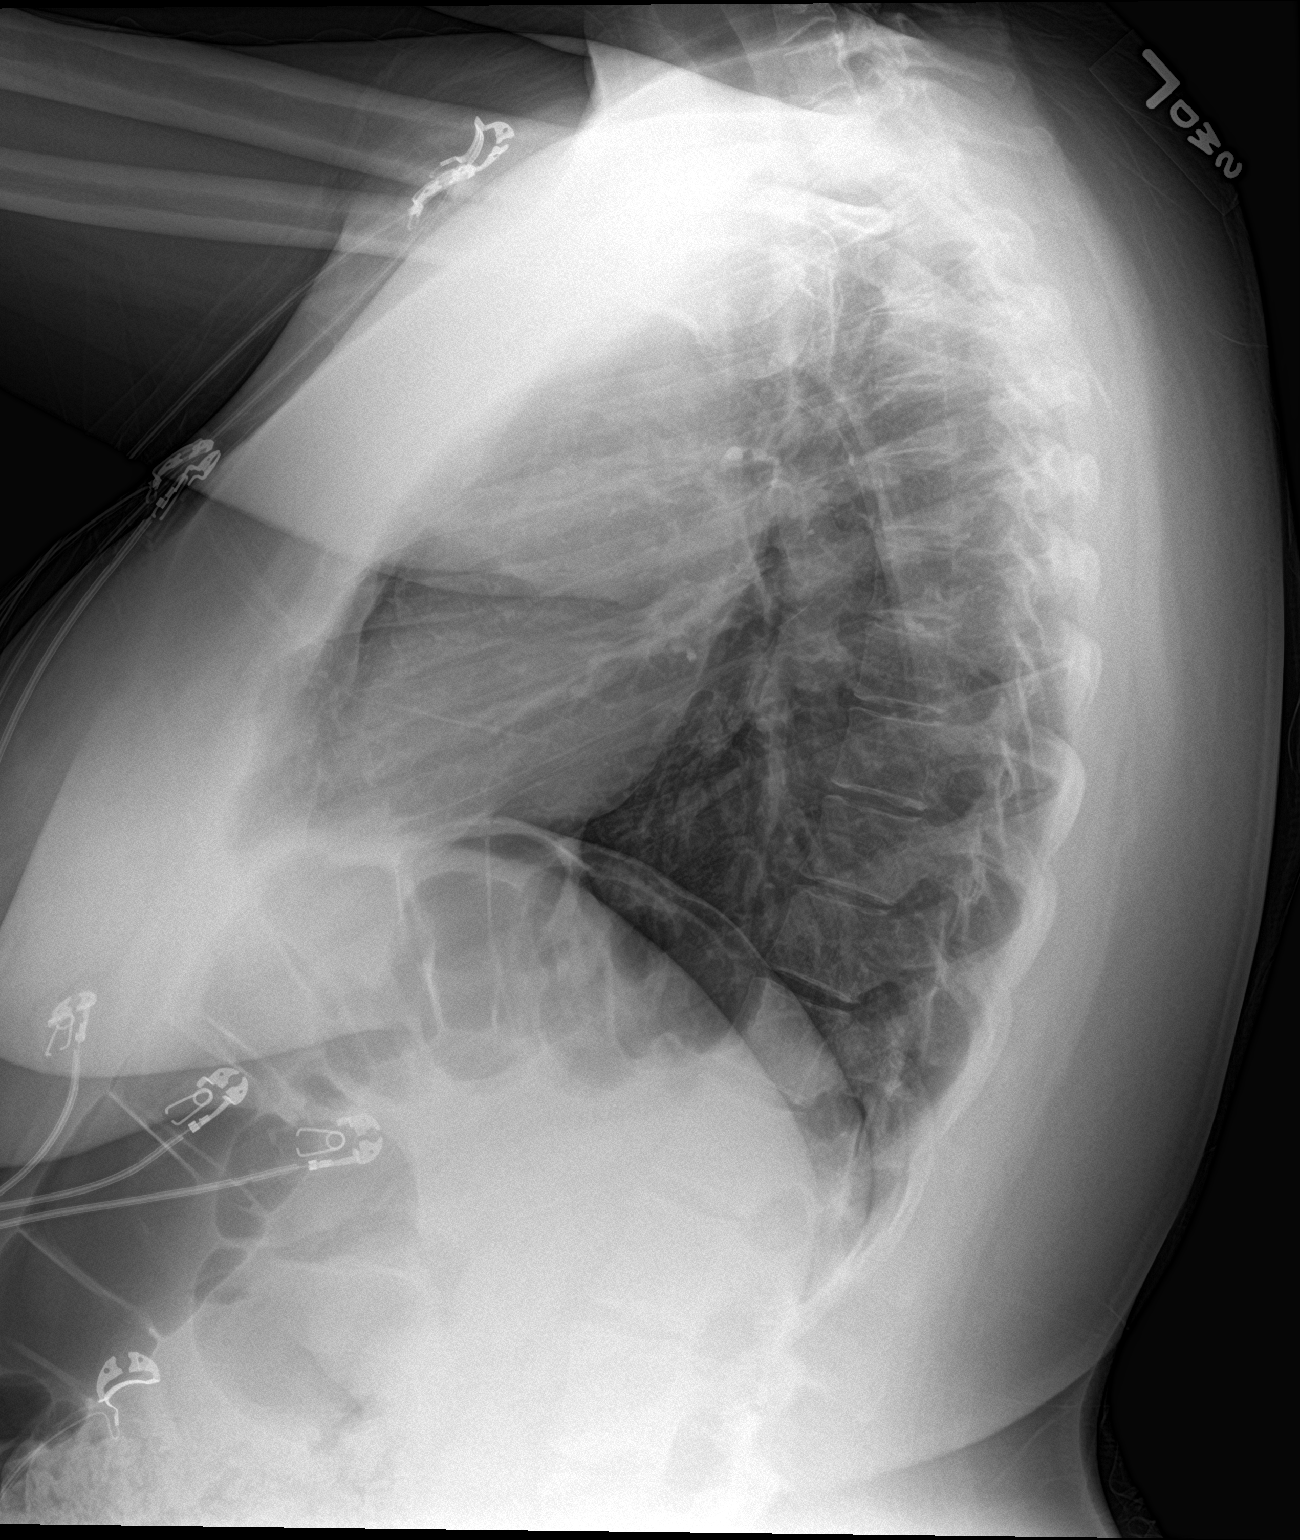

[2 of 2 positions shown; findings below may reference images not displayed]

FINDINGS: The heart size and mediastinal contours are within normal limits.
Both lungs are clear. The visualized skeletal structures are
unremarkable.
IMPRESSION: No active cardiopulmonary disease.

## 2022-07-04 ENCOUNTER — Other Ambulatory Visit: Payer: Self-pay

## 2022-07-04 DIAGNOSIS — F909 Attention-deficit hyperactivity disorder, unspecified type: Secondary | ICD-10-CM | POA: Diagnosis not present

## 2022-07-04 DIAGNOSIS — F411 Generalized anxiety disorder: Secondary | ICD-10-CM | POA: Diagnosis not present

## 2022-07-04 MED ORDER — SERTRALINE HCL 50 MG PO TABS
50.0000 mg | ORAL_TABLET | Freq: Every day | ORAL | 3 refills | Status: AC
  Filled 2022-07-04: qty 90, 90d supply, fill #0

## 2022-07-04 MED ORDER — VICTOZA 18 MG/3ML ~~LOC~~ SOPN
PEN_INJECTOR | SUBCUTANEOUS | 4 refills | Status: AC
  Filled 2022-07-04: qty 9, 30d supply, fill #0

## 2022-07-04 MED ORDER — INSULIN PEN NEEDLE 31G X 5 MM MISC
5 refills | Status: AC
  Filled 2022-07-04: qty 100, 90d supply, fill #0

## 2022-07-04 MED ORDER — AMPHETAMINE-DEXTROAMPHET ER 10 MG PO CP24
10.0000 mg | ORAL_CAPSULE | Freq: Every day | ORAL | 0 refills | Status: AC
  Filled 2022-07-04 (×2): qty 30, 30d supply, fill #0

## 2022-07-07 ENCOUNTER — Other Ambulatory Visit: Payer: Self-pay

## 2022-07-30 ENCOUNTER — Other Ambulatory Visit: Payer: Self-pay

## 2022-10-21 ENCOUNTER — Other Ambulatory Visit: Payer: Self-pay

## 2022-10-21 MED ORDER — AMPHETAMINE-DEXTROAMPHET ER 10 MG PO CP24
10.0000 mg | ORAL_CAPSULE | Freq: Every day | ORAL | 0 refills | Status: DC
  Filled 2022-10-21: qty 30, 30d supply, fill #0

## 2022-11-19 ENCOUNTER — Other Ambulatory Visit: Payer: Self-pay

## 2022-11-19 MED ORDER — AMPHETAMINE-DEXTROAMPHET ER 10 MG PO CP24
10.0000 mg | ORAL_CAPSULE | Freq: Every day | ORAL | 0 refills | Status: AC
  Filled 2022-11-19: qty 30, 30d supply, fill #0

## 2022-12-11 ENCOUNTER — Other Ambulatory Visit: Payer: Self-pay

## 2022-12-11 MED ORDER — SERTRALINE HCL 50 MG PO TABS
50.0000 mg | ORAL_TABLET | Freq: Every day | ORAL | 3 refills | Status: DC
  Filled 2022-12-11 – 2022-12-24 (×2): qty 90, 90d supply, fill #0

## 2022-12-11 MED ORDER — AMPHETAMINE-DEXTROAMPHET ER 15 MG PO CP24
15.0000 mg | ORAL_CAPSULE | Freq: Every day | ORAL | 0 refills | Status: DC
  Filled 2022-12-11 – 2022-12-24 (×2): qty 30, 30d supply, fill #0

## 2022-12-22 ENCOUNTER — Other Ambulatory Visit: Payer: Self-pay

## 2022-12-24 ENCOUNTER — Other Ambulatory Visit: Payer: Self-pay

## 2023-01-19 ENCOUNTER — Other Ambulatory Visit: Payer: Self-pay

## 2023-01-19 MED ORDER — AMPHETAMINE-DEXTROAMPHET ER 15 MG PO CP24
15.0000 mg | ORAL_CAPSULE | Freq: Every day | ORAL | 0 refills | Status: DC
  Filled 2023-01-19 – 2023-01-21 (×2): qty 30, 30d supply, fill #0

## 2023-01-21 ENCOUNTER — Other Ambulatory Visit: Payer: Self-pay

## 2023-01-29 DIAGNOSIS — H52223 Regular astigmatism, bilateral: Secondary | ICD-10-CM | POA: Diagnosis not present

## 2023-01-29 DIAGNOSIS — H5213 Myopia, bilateral: Secondary | ICD-10-CM | POA: Diagnosis not present

## 2023-01-29 DIAGNOSIS — H47323 Drusen of optic disc, bilateral: Secondary | ICD-10-CM | POA: Diagnosis not present

## 2023-03-05 ENCOUNTER — Other Ambulatory Visit: Payer: Self-pay

## 2023-03-05 MED ORDER — AMPHETAMINE-DEXTROAMPHET ER 15 MG PO CP24
15.0000 mg | ORAL_CAPSULE | Freq: Every day | ORAL | 0 refills | Status: AC
  Filled 2023-03-05: qty 30, 30d supply, fill #0

## 2023-03-18 ENCOUNTER — Other Ambulatory Visit: Payer: Self-pay

## 2023-04-22 ENCOUNTER — Other Ambulatory Visit (HOSPITAL_BASED_OUTPATIENT_CLINIC_OR_DEPARTMENT_OTHER): Payer: Self-pay

## 2023-05-28 DIAGNOSIS — H47323 Drusen of optic disc, bilateral: Secondary | ICD-10-CM | POA: Diagnosis not present

## 2023-07-30 ENCOUNTER — Other Ambulatory Visit: Payer: Self-pay

## 2023-07-30 DIAGNOSIS — E669 Obesity, unspecified: Secondary | ICD-10-CM | POA: Diagnosis not present

## 2023-07-30 DIAGNOSIS — Z0000 Encounter for general adult medical examination without abnormal findings: Secondary | ICD-10-CM | POA: Diagnosis not present

## 2023-07-30 DIAGNOSIS — Z1389 Encounter for screening for other disorder: Secondary | ICD-10-CM | POA: Diagnosis not present

## 2023-07-30 MED ORDER — WEGOVY 0.25 MG/0.5ML ~~LOC~~ SOAJ
0.2500 mg | SUBCUTANEOUS | 3 refills | Status: AC
  Filled 2023-07-30: qty 2, 28d supply, fill #0

## 2023-12-07 DIAGNOSIS — R55 Syncope and collapse: Secondary | ICD-10-CM | POA: Diagnosis not present

## 2023-12-07 DIAGNOSIS — Z1389 Encounter for screening for other disorder: Secondary | ICD-10-CM | POA: Diagnosis not present

## 2023-12-08 DIAGNOSIS — R55 Syncope and collapse: Secondary | ICD-10-CM | POA: Diagnosis not present

## 2023-12-08 DIAGNOSIS — Z131 Encounter for screening for diabetes mellitus: Secondary | ICD-10-CM | POA: Diagnosis not present

## 2023-12-17 DIAGNOSIS — R42 Dizziness and giddiness: Secondary | ICD-10-CM | POA: Diagnosis not present

## 2023-12-17 DIAGNOSIS — R55 Syncope and collapse: Secondary | ICD-10-CM | POA: Diagnosis not present

## 2024-01-07 DIAGNOSIS — R42 Dizziness and giddiness: Secondary | ICD-10-CM | POA: Diagnosis not present

## 2024-01-07 DIAGNOSIS — R55 Syncope and collapse: Secondary | ICD-10-CM | POA: Diagnosis not present

## 2024-01-14 DIAGNOSIS — R55 Syncope and collapse: Secondary | ICD-10-CM | POA: Diagnosis not present

## 2024-02-16 ENCOUNTER — Other Ambulatory Visit: Payer: Self-pay

## 2024-02-17 ENCOUNTER — Other Ambulatory Visit: Payer: Self-pay

## 2024-02-17 MED ORDER — SERTRALINE HCL 50 MG PO TABS
50.0000 mg | ORAL_TABLET | Freq: Every day | ORAL | 3 refills | Status: AC
  Filled 2024-02-17: qty 90, 90d supply, fill #0

## 2024-02-19 ENCOUNTER — Other Ambulatory Visit: Payer: Self-pay

## 2024-02-22 ENCOUNTER — Other Ambulatory Visit: Payer: Self-pay

## 2024-02-23 ENCOUNTER — Other Ambulatory Visit: Payer: Self-pay

## 2024-02-24 ENCOUNTER — Other Ambulatory Visit: Payer: Self-pay

## 2024-02-25 ENCOUNTER — Other Ambulatory Visit: Payer: Self-pay

## 2024-03-11 ENCOUNTER — Other Ambulatory Visit (HOSPITAL_COMMUNITY): Payer: Self-pay

## 2024-03-11 ENCOUNTER — Other Ambulatory Visit: Payer: Self-pay

## 2024-03-11 DIAGNOSIS — Z1389 Encounter for screening for other disorder: Secondary | ICD-10-CM | POA: Diagnosis not present

## 2024-03-11 DIAGNOSIS — H1030 Unspecified acute conjunctivitis, unspecified eye: Secondary | ICD-10-CM | POA: Diagnosis not present

## 2024-03-11 DIAGNOSIS — J069 Acute upper respiratory infection, unspecified: Secondary | ICD-10-CM | POA: Diagnosis not present

## 2024-03-11 MED ORDER — BACITRA-NEOMYCIN-POLYMYXIN-HC 1 % OP OINT
TOPICAL_OINTMENT | OPHTHALMIC | 0 refills | Status: AC
  Filled 2024-03-11: qty 7, 30d supply, fill #0

## 2024-03-14 ENCOUNTER — Other Ambulatory Visit: Payer: Self-pay

## 2024-03-25 ENCOUNTER — Other Ambulatory Visit: Payer: Self-pay

## 2024-05-06 ENCOUNTER — Other Ambulatory Visit: Payer: Self-pay

## 2024-05-06 DIAGNOSIS — F411 Generalized anxiety disorder: Secondary | ICD-10-CM | POA: Diagnosis not present

## 2024-05-06 DIAGNOSIS — E669 Obesity, unspecified: Secondary | ICD-10-CM | POA: Diagnosis not present

## 2024-05-06 DIAGNOSIS — Z1331 Encounter for screening for depression: Secondary | ICD-10-CM | POA: Diagnosis not present

## 2024-05-06 DIAGNOSIS — Z0000 Encounter for general adult medical examination without abnormal findings: Secondary | ICD-10-CM | POA: Diagnosis not present

## 2024-05-06 DIAGNOSIS — Z1389 Encounter for screening for other disorder: Secondary | ICD-10-CM | POA: Diagnosis not present

## 2024-05-06 MED ORDER — SERTRALINE HCL 150 MG PO CAPS
150.0000 mg | ORAL_CAPSULE | Freq: Every day | ORAL | 3 refills | Status: AC
  Filled 2024-05-06 (×2): qty 30, 30d supply, fill #0

## 2024-05-09 ENCOUNTER — Other Ambulatory Visit: Payer: Self-pay

## 2024-05-10 ENCOUNTER — Other Ambulatory Visit: Payer: Self-pay

## 2024-05-11 ENCOUNTER — Other Ambulatory Visit: Payer: Self-pay

## 2024-05-11 MED ORDER — SERTRALINE HCL 50 MG PO TABS
150.0000 mg | ORAL_TABLET | Freq: Every day | ORAL | 1 refills | Status: AC
  Filled 2024-05-11: qty 270, 90d supply, fill #0

## 2024-05-12 ENCOUNTER — Other Ambulatory Visit: Payer: Self-pay
